# Patient Record
Sex: Female | Born: 1975 | Race: White | Hispanic: No | Marital: Married | Smoking: Former smoker
Health system: Southern US, Community
[De-identification: ages and names within clinical notes are randomized; demographics above are authoritative.]

## PROBLEM LIST (undated history)

## (undated) DIAGNOSIS — L309 Dermatitis, unspecified: Secondary | ICD-10-CM

## (undated) DIAGNOSIS — G43909 Migraine, unspecified, not intractable, without status migrainosus: Secondary | ICD-10-CM

## (undated) DIAGNOSIS — L509 Urticaria, unspecified: Secondary | ICD-10-CM

## (undated) DIAGNOSIS — J45909 Unspecified asthma, uncomplicated: Secondary | ICD-10-CM

## (undated) HISTORY — DX: Urticaria, unspecified: L50.9

## (undated) HISTORY — DX: Dermatitis, unspecified: L30.9

## (undated) HISTORY — PX: WISDOM TOOTH EXTRACTION: SHX21

## (undated) HISTORY — DX: Migraine, unspecified, not intractable, without status migrainosus: G43.909

## (undated) HISTORY — PX: SINOSCOPY: SHX187

## (undated) HISTORY — DX: Unspecified asthma, uncomplicated: J45.909

---

## 2019-12-11 DIAGNOSIS — B029 Zoster without complications: Secondary | ICD-10-CM

## 2019-12-11 HISTORY — DX: Zoster without complications: B02.9

## 2020-01-19 DIAGNOSIS — J301 Allergic rhinitis due to pollen: Secondary | ICD-10-CM | POA: Diagnosis not present

## 2020-01-19 DIAGNOSIS — B001 Herpesviral vesicular dermatitis: Secondary | ICD-10-CM | POA: Diagnosis not present

## 2020-01-19 DIAGNOSIS — J329 Chronic sinusitis, unspecified: Secondary | ICD-10-CM | POA: Diagnosis not present

## 2020-01-19 DIAGNOSIS — M25511 Pain in right shoulder: Secondary | ICD-10-CM | POA: Diagnosis not present

## 2020-01-19 DIAGNOSIS — E039 Hypothyroidism, unspecified: Secondary | ICD-10-CM | POA: Diagnosis not present

## 2020-01-23 DIAGNOSIS — J320 Chronic maxillary sinusitis: Secondary | ICD-10-CM | POA: Diagnosis not present

## 2020-01-25 DIAGNOSIS — G43909 Migraine, unspecified, not intractable, without status migrainosus: Secondary | ICD-10-CM | POA: Diagnosis not present

## 2020-01-30 DIAGNOSIS — J309 Allergic rhinitis, unspecified: Secondary | ICD-10-CM | POA: Diagnosis not present

## 2020-01-31 ENCOUNTER — Ambulatory Visit
Admission: RE | Admit: 2020-01-31 | Discharge: 2020-01-31 | Disposition: A | Discharge status: Home / Self Care | Payer: BC Managed Care – PPO | Source: Ambulatory Visit | Attending: Internal Medicine | Admitting: Internal Medicine

## 2020-01-31 ENCOUNTER — Other Ambulatory Visit: Payer: Self-pay | Admitting: Internal Medicine

## 2020-01-31 DIAGNOSIS — M25512 Pain in left shoulder: Secondary | ICD-10-CM | POA: Diagnosis not present

## 2020-01-31 DIAGNOSIS — S00522S Blister (nonthermal) of oral cavity, sequela: Secondary | ICD-10-CM | POA: Diagnosis not present

## 2020-01-31 DIAGNOSIS — E039 Hypothyroidism, unspecified: Secondary | ICD-10-CM | POA: Diagnosis not present

## 2020-01-31 DIAGNOSIS — J301 Allergic rhinitis due to pollen: Secondary | ICD-10-CM | POA: Diagnosis not present

## 2020-02-22 DIAGNOSIS — N644 Mastodynia: Secondary | ICD-10-CM | POA: Diagnosis not present

## 2020-02-22 DIAGNOSIS — N6314 Unspecified lump in the right breast, lower inner quadrant: Secondary | ICD-10-CM | POA: Diagnosis not present

## 2020-02-22 DIAGNOSIS — R110 Nausea: Secondary | ICD-10-CM | POA: Diagnosis not present

## 2020-02-26 DIAGNOSIS — N644 Mastodynia: Secondary | ICD-10-CM | POA: Diagnosis not present

## 2020-02-26 DIAGNOSIS — R922 Inconclusive mammogram: Secondary | ICD-10-CM | POA: Diagnosis not present

## 2020-02-26 DIAGNOSIS — N6489 Other specified disorders of breast: Secondary | ICD-10-CM | POA: Diagnosis not present

## 2020-03-07 DIAGNOSIS — R109 Unspecified abdominal pain: Secondary | ICD-10-CM | POA: Diagnosis not present

## 2020-03-07 DIAGNOSIS — R110 Nausea: Secondary | ICD-10-CM | POA: Diagnosis not present

## 2020-03-13 DIAGNOSIS — E782 Mixed hyperlipidemia: Secondary | ICD-10-CM | POA: Diagnosis not present

## 2020-03-13 DIAGNOSIS — N951 Menopausal and female climacteric states: Secondary | ICD-10-CM | POA: Diagnosis not present

## 2020-03-13 DIAGNOSIS — E039 Hypothyroidism, unspecified: Secondary | ICD-10-CM | POA: Diagnosis not present

## 2020-03-13 DIAGNOSIS — R635 Abnormal weight gain: Secondary | ICD-10-CM | POA: Diagnosis not present

## 2020-03-13 DIAGNOSIS — R7989 Other specified abnormal findings of blood chemistry: Secondary | ICD-10-CM | POA: Diagnosis not present

## 2020-03-16 DIAGNOSIS — S93504A Unspecified sprain of right lesser toe(s), initial encounter: Secondary | ICD-10-CM | POA: Diagnosis not present

## 2020-03-21 DIAGNOSIS — E039 Hypothyroidism, unspecified: Secondary | ICD-10-CM | POA: Diagnosis not present

## 2020-03-21 DIAGNOSIS — N644 Mastodynia: Secondary | ICD-10-CM | POA: Diagnosis not present

## 2020-03-21 DIAGNOSIS — Z01419 Encounter for gynecological examination (general) (routine) without abnormal findings: Secondary | ICD-10-CM | POA: Diagnosis not present

## 2020-03-21 DIAGNOSIS — F3281 Premenstrual dysphoric disorder: Secondary | ICD-10-CM | POA: Diagnosis not present

## 2020-03-28 DIAGNOSIS — N951 Menopausal and female climacteric states: Secondary | ICD-10-CM | POA: Diagnosis not present

## 2020-03-28 DIAGNOSIS — G43519 Persistent migraine aura without cerebral infarction, intractable, without status migrainosus: Secondary | ICD-10-CM | POA: Diagnosis not present

## 2020-03-28 DIAGNOSIS — D509 Iron deficiency anemia, unspecified: Secondary | ICD-10-CM | POA: Diagnosis not present

## 2020-03-28 DIAGNOSIS — E039 Hypothyroidism, unspecified: Secondary | ICD-10-CM | POA: Diagnosis not present

## 2020-03-28 DIAGNOSIS — Z6828 Body mass index (BMI) 28.0-28.9, adult: Secondary | ICD-10-CM | POA: Diagnosis not present

## 2020-03-28 DIAGNOSIS — E782 Mixed hyperlipidemia: Secondary | ICD-10-CM | POA: Diagnosis not present

## 2020-04-10 DIAGNOSIS — E039 Hypothyroidism, unspecified: Secondary | ICD-10-CM | POA: Diagnosis not present

## 2020-04-22 DIAGNOSIS — D225 Melanocytic nevi of trunk: Secondary | ICD-10-CM | POA: Diagnosis not present

## 2020-04-22 DIAGNOSIS — L821 Other seborrheic keratosis: Secondary | ICD-10-CM | POA: Diagnosis not present

## 2020-04-22 DIAGNOSIS — D1801 Hemangioma of skin and subcutaneous tissue: Secondary | ICD-10-CM | POA: Diagnosis not present

## 2020-04-22 DIAGNOSIS — L814 Other melanin hyperpigmentation: Secondary | ICD-10-CM | POA: Diagnosis not present

## 2020-05-06 DIAGNOSIS — E041 Nontoxic single thyroid nodule: Secondary | ICD-10-CM | POA: Diagnosis not present

## 2020-05-06 DIAGNOSIS — E039 Hypothyroidism, unspecified: Secondary | ICD-10-CM | POA: Diagnosis not present

## 2020-05-06 DIAGNOSIS — Z8349 Family history of other endocrine, nutritional and metabolic diseases: Secondary | ICD-10-CM | POA: Diagnosis not present

## 2020-05-06 DIAGNOSIS — Z8619 Personal history of other infectious and parasitic diseases: Secondary | ICD-10-CM | POA: Diagnosis not present

## 2020-05-16 DIAGNOSIS — J069 Acute upper respiratory infection, unspecified: Secondary | ICD-10-CM | POA: Diagnosis not present

## 2020-05-16 DIAGNOSIS — Z03818 Encounter for observation for suspected exposure to other biological agents ruled out: Secondary | ICD-10-CM | POA: Diagnosis not present

## 2020-05-20 DIAGNOSIS — H52221 Regular astigmatism, right eye: Secondary | ICD-10-CM | POA: Diagnosis not present

## 2020-05-23 DIAGNOSIS — J309 Allergic rhinitis, unspecified: Secondary | ICD-10-CM | POA: Diagnosis not present

## 2020-05-23 DIAGNOSIS — J320 Chronic maxillary sinusitis: Secondary | ICD-10-CM | POA: Diagnosis not present

## 2020-06-05 DIAGNOSIS — E039 Hypothyroidism, unspecified: Secondary | ICD-10-CM | POA: Diagnosis not present

## 2020-06-19 DIAGNOSIS — G43009 Migraine without aura, not intractable, without status migrainosus: Secondary | ICD-10-CM | POA: Diagnosis not present

## 2020-06-24 ENCOUNTER — Other Ambulatory Visit: Payer: Self-pay

## 2020-06-24 ENCOUNTER — Encounter: Payer: Self-pay | Admitting: Allergy

## 2020-06-24 ENCOUNTER — Ambulatory Visit (INDEPENDENT_AMBULATORY_CARE_PROVIDER_SITE_OTHER): Payer: BC Managed Care – PPO | Admitting: Allergy

## 2020-06-24 VITALS — BP 114/70 | HR 88 | Temp 98.1°F | Resp 18 | Ht 68.0 in | Wt 183.0 lb

## 2020-06-24 DIAGNOSIS — T781XXD Other adverse food reactions, not elsewhere classified, subsequent encounter: Secondary | ICD-10-CM

## 2020-06-24 DIAGNOSIS — J310 Chronic rhinitis: Secondary | ICD-10-CM | POA: Insufficient documentation

## 2020-06-24 DIAGNOSIS — Z87898 Personal history of other specified conditions: Secondary | ICD-10-CM | POA: Diagnosis not present

## 2020-06-24 DIAGNOSIS — Z91030 Bee allergy status: Secondary | ICD-10-CM | POA: Diagnosis not present

## 2020-06-24 DIAGNOSIS — Z91038 Other insect allergy status: Secondary | ICD-10-CM

## 2020-06-24 DIAGNOSIS — T781XXA Other adverse food reactions, not elsewhere classified, initial encounter: Secondary | ICD-10-CM | POA: Insufficient documentation

## 2020-06-24 DIAGNOSIS — Z8619 Personal history of other infectious and parasitic diseases: Secondary | ICD-10-CM

## 2020-06-24 DIAGNOSIS — Z872 Personal history of diseases of the skin and subcutaneous tissue: Secondary | ICD-10-CM

## 2020-06-24 DIAGNOSIS — Z130 Encounter for screening for diseases of the blood and blood-forming organs and certain disorders involving the immune mechanism: Secondary | ICD-10-CM | POA: Insufficient documentation

## 2020-06-24 MED ORDER — EPINEPHRINE 0.3 MG/0.3ML IJ SOAJ: 0.3000 mg | INTRAMUSCULAR | 1 refills | Status: DC | PRN

## 2020-06-24 MED ORDER — AZELASTINE HCL 0.1 % NA SOLN: 1.0000 | NASAL | 5 refills | Status: DC | PRN

## 2020-06-24 NOTE — Progress Notes (Addendum)
New Patient Note  RE: Theresa Black MRN: [id number] DOB: [birth year] Date of Office Visit: 06/24/2020  Referring provider: Talmage Coin, MD Primary care provider: Henrine Screws, MD  Chief Complaint: Herpes Zoster (history of shingles. has had two episodes in the past two years. believes it is related to her thyroid.)  History of Present Illness: I had the pleasure of seeing Theresa Black for initial evaluation at the [facility] on 06/25/2020. She is a 44 y.o. female, who is referred here by Dr. Sharl Ma (endocrinology) for the evaluation of immune system and allergic rhinitis.   Immune system: Patient developed blistering rash on her lips after she was done taking care of her husband with COVID-19 in March 2020. [place] she developed similar rash on her lip and she thought it was due to the stress of moving. She also developed some diffuse joint pains which she thought was from moving. Initially thought she just had a HSV but then she had antibodies checked for Shingles which was positive per patient report - in process of obtaining records.   She did not get the Shingles vaccine due to her age. Patient had chicken pox as a child.    Patient has history of multiple infections including sinus infection, ear infections. Denies any pneumonia, GI infections/diarrhea, skin infections/abscesses. Patient has no history of opportunistic infections including fungal infections.     Patient reports 5 antibiotic use in the last 12 months and 0 hospital admissions. Patient does not have any secondary causes of immunodeficiency including chronic steroid use, diabetes mellitus, protein losing enteropathy, renal or hepatic dysfunction, history of cancer or irradiation or history of HIV, hepatitis B or C.  Rhinitis: She reports symptoms of sinus pressure, nasal congestion, rhinorrhea . Symptoms have been going on for 30+ years. The symptoms are present all  year around with worsening in spring through fall. Anosmia: no. Headache: yes. She has used azelastine, Claritin, zyrtec with some improvement in symptoms. Steroid nasal sprays cause acid reflux. Sinus infections: yes. Previous work up includes: last skin testing in 2020 by ENT showed multiple positives per patient report. Patient was most recently on SLIT by ENT with good benefit. She also had allergy injections in the past as well which did not help.  Previous ENT evaluation: yes and had surgery in 2019. History of nasal polyps: no. History of reflux: yes but not on medications currently.   Patient follows with endocrinology for hypothyroidism.    Assessment and Plan: Theresa Black is a 44 y.o. female with: Encounter for screening for diseases of the blood and blood-forming organs and certain disorders involving the immune mechanism Patient had 2 episodes of blistering lip rash which was initially thought to be from HSV but had positive Shingles antibodies per patient report. Both episodes occurred after increased personal stress. There is also history of frequent sinus infections and using 5 courses of antibiotics within the last 12 months.  Awaiting bloodwork results. . Get bloodwork to look at immune system.   Keep track of infections and antibiotics use.  Patient may benefit from having antiviral oral agent on hand to take at first sign of repeat blistering rash.   Chronic rhinitis Perennial rhinitis symptoms with frequent sinus infections for the last 30+ years.  Used azelastine and over-the-counter antihistamines with minimal benefit.  Steroid nasal sprays cause acid reflux.  Patient was followed by ENT and had skin testing done in 2020 which was positive to grass,  weed, trees, molds, dust mites, cockroach, cat and dog.  She was on SLIT therapy with good benefit. Allergy injections did not work as well in the past. Sinus surgery in 2019.  Today's skin testing showed: Borderline positive  to mouse. Negative to other indoor/outdoor allergens. Negative to wheat.   Will get bloodwork to double check this.   May use over the counter antihistamines such as Zyrtec (cetirizine), Claritin (loratadine), Allegra (fexofenadine), or Xyzal (levocetirizine) daily as needed.  May use azelastine nasal spray 1-2 sprays per nostril twice a day as needed for runny nose/drainage.  Nasal saline spray (i.e., Simply Saline) or nasal saline lavage (i.e., NeilMed) is recommended as needed and prior to medicated nasal sprays.  History of wheezing Patient used to be on Singulair but not taking anymore.  States that allergies will sometimes cause wheezing however no issues since she moved to the [place] area.  No prior inhaler use.  Today's spirometry was normal.  Monitor symptoms.  Adverse food reaction Currently avoiding gluten as it flares her IBS symptoms and causes some type of knots on her skin.  She apparently had multiple celiac panel bloodwork which were negative.  Today's skin testing was negative to wheat.  Advised patient to continue to avoid gluten as she may have a non-IgE mediated intolerance to gluten.  History of urticaria History of hives started at age 89 but no recent outbreaks.  No specific triggers noted.  Monitor symptoms. Take pictures if hives re-occur.   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  Continue avoidance and will check for hymenoptera panel and tryptase level.  Return in about 3 months (around 09/24/2020).   ADDENDUM:  01/19/2020 bloodwork. Varicella zoster IgG 1020 (immune >165) HSV1 IgG 46.50 HSV 2 IgG <0.91  Meds ordered this encounter  Medications  . EPINEPHrine (AUVI-Q) 0.3 mg/0.3 mL IJ SOAJ injection    Sig: Inject 0.3 mLs (0.3 mg total) into the muscle as needed for anaphylaxis.    Dispense:  2 each    Refill:  1    [id number]  . azelastine  (ASTELIN) 0.1 % nasal spray    Sig: Place 1-2 sprays into both nostrils as needed for rhinitis.    Dispense:  30 mL    Refill:  5    Lab Orders     CBC with Differential/Platelet     IgG, IgA, IgM     Lymph Enumeration, Basic & NK Cells     Strep pneumoniae 23 Serotypes IgG     Diphtheria / Tetanus Antibody Panel     Complement, total     Allergens w/Total IgE Area 2     Wheat IgE     Allergen Gluten f79     Allergen Hymenoptera Panel     Tryptase  Other allergy screening: Asthma:   Patient used to be on Singulair but currently is not taking it. Triggers include allergies which sometimes caused wheezing. No issues since moved to [place] area.   No prior inhaler use.    Food allergy: yes  Gluten causes some type of knots on her skin and causes IBS symptoms.  Medication allergy: yes Hymenoptera allergy: large localized swelling.  Urticaria: yes  Hives started at age 89 but no recent outbreaks. Eczema:no  Diagnostics: Spirometry:  Tracings reviewed. Her effort: Good reproducible efforts. FVC: 4.24L FEV1: 3.54L, 105% predicted FEV1/FVC ratio: 83% Interpretation: Spirometry consistent with normal pattern.  Please see scanned spirometry results for details.  Skin Testing: Environmental allergy panel and select foods. Borderline positive to mouse. Negative to other indoor/outdoor allergens. Negative to wheat.  Results discussed with patient/family.  Airborne Adult Perc - 06/24/20 0900    Time Antigen Placed 0940    Allergen Manufacturer Waynette Buttery    Location Back    Number of Test 59    2. Control-Histamine 1 mg/ml 2+    3. Albumin saline Negative    4. Bahia Negative    5. French Southern Territories Negative    6. Johnson Negative    7. Kentucky Blue Negative    8. Meadow Fescue Negative    9. Perennial Rye Negative    10. Sweet Vernal Negative    11. Timothy Negative    12. Cocklebur Negative    13. Burweed Marshelder Negative    14. Ragweed, short Negative    15. Ragweed, Giant Negative     16. Plantain,  English Negative    17. Lamb's Quarters Negative    18. Sheep Sorrell Negative    19. Rough Pigweed Negative    20. Marsh Elder, Rough Negative    21. Mugwort, Common Negative    22. Ash mix Negative    23. Birch mix Negative    24. Beech American Negative    25. Box, Elder Negative    26. Cedar, red Negative    27. Cottonwood, Guinea-Bissau Negative    28. Elm mix Negative    29. Hickory Negative    30. Maple mix Negative    31. Oak, Guinea-Bissau mix Negative    32. Pecan Pollen Negative    33. Pine mix Negative    34. Sycamore Eastern Negative    35. Walnut, Black Pollen Negative    36. Alternaria alternata Negative    37. Cladosporium Herbarum Negative    38. Aspergillus mix Negative    39. Penicillium mix Negative    40. Bipolaris sorokiniana (Helminthosporium) Negative    41. Drechslera spicifera (Curvularia) Negative    42. Mucor plumbeus Negative    43. Fusarium moniliforme Negative    44. Aureobasidium pullulans (pullulara) Negative    45. Rhizopus oryzae Negative    46. Botrytis cinera Negative    47. Epicoccum nigrum Negative    48. Phoma betae Negative    49. Candida Albicans Negative    50. Trichophyton mentagrophytes Negative    51. Mite, D Farinae  5,000 AU/ml Negative    52. Mite, D Pteronyssinus  5,000 AU/ml Negative    53. Cat Hair 10,000 BAU/ml Negative    54.  Dog Epithelia Negative    55. Mixed Feathers Negative    56. Horse Epithelia Negative    57. Cockroach, German Negative    58. Mouse --   -/+   15. Tobacco Leaf Negative          Food Adult Perc - 06/24/20 0900    Time Antigen Placed 0940    Allergen Manufacturer Waynette Buttery    Location Back    Number of allergen test 1    3. Wheat Negative           Past Medical History: Patient Active Problem List   Diagnosis Date Noted  . Encounter for screening for diseases of the blood and blood-forming organs and certain disorders involving the immune mechanism 06/24/2020  . History of  shingles 06/24/2020  . History of wheezing 06/24/2020  . History of urticaria 06/24/2020  . Chronic rhinitis 06/24/2020  .  Adverse food reaction 06/24/2020  . Hymenoptera allergy 06/24/2020   Past Medical History:  Diagnosis Date  . Asthma   . Eczema   . Urticaria    Past Surgical History: Past Surgical History:  Procedure Laterality Date  . CESAREAN SECTION    . SINOSCOPY    . WISDOM TOOTH EXTRACTION     Medication List:  Current Outpatient Medications  Medication Sig Dispense Refill  . Acetaminophen (TYLENOL EXTRA STRENGTH PO) Take by mouth.    Marland Kitchen azelastine (ASTELIN) 0.1 % nasal spray Place 1-2 sprays into both nostrils as needed for rhinitis. 30 mL 5  . Cetirizine HCl 10 MG CAPS SMARTSIG:By Mouth    . Diclofenac Potassium (ZIPSOR) 25 MG CAPS Take by mouth.    Marland Kitchen FLUoxetine (PROZAC) 20 MG capsule Take by mouth.    Marland Kitchen ibuprofen (ADVIL) 800 MG tablet     . loratadine (CLARITIN) 10 MG tablet Take 10 mg by mouth daily.    Marland Kitchen TIROSINT 100 MCG CAPS Take 1 capsule by mouth daily.    Marland Kitchen EPINEPHrine (AUVI-Q) 0.3 mg/0.3 mL IJ SOAJ injection Inject 0.3 mLs (0.3 mg total) into the muscle as needed for anaphylaxis. 2 each 1   No current facility-administered medications for this visit.   Allergies: Allergies  Allergen Reactions  . Amoxicillin Hives  . Sulfamethoxazole-Trimethoprim Nausea And Vomiting and Hives  . Levonorgestrel Itching and Other (See Comments)    She gets irritable , and rash from any hormone replacement She gets irritable , and rash from any hormone replacement   . Orphenadrine Citrate Other (See Comments)    EMOTIONAL DISTRESS "can't stop crying" "can't stop crying" EMOTIONAL DISTRESS    Social History: Social History   Socioeconomic History  . Marital status: Married    Spouse name: Not on file  . Number of children: Not on file  . Years of education: Not on file  . Highest education level: Not on file  Occupational History  . Not on file  Tobacco  Use  . Smoking status: Former Smoker    Quit date: 2004    Years since quitting: 17.6  . Smokeless tobacco: Never Used  Vaping Use  . Vaping Use: Never used  Substance and Sexual Activity  . Alcohol use: Yes    Comment: on occasion  . Drug use: Never  . Sexual activity: Not on file  Other Topics Concern  . Not on file  Social History Narrative  . Not on file   Social Determinants of Health   Financial Resource Strain:   . Difficulty of Paying Living Expenses:   Food Insecurity:   . Worried About Programme researcher, broadcasting/film/video in the Last Year:   . Barista in the Last Year:   Transportation Needs:   . Freight forwarder (Medical):   Marland Kitchen Lack of Transportation (Non-Medical):   Physical Activity:   . Days of Exercise per Week:   . Minutes of Exercise per Session:   Stress:   . Feeling of Stress :   Social Connections:   . Frequency of Communication with Friends and Family:   . Frequency of Social Gatherings with Friends and Family:   . Attends Religious Services:   . Active Member of Clubs or Organizations:   . Attends Banker Meetings:   Marland Kitchen Marital Status:    Lives in a house built in 1998 - moved in January 2021. Smoking: denies Occupation: Designer, fashion/clothing HistorySurveyor, minerals in  the house: yes Carpet in the family room: no Carpet in the bedroom: no Heating: electric Cooling: central Pet: yes 2 cats and 1 dog  Family History: Family History  Problem Relation Age of Onset  . Hashimoto's thyroiditis Mother    Problem                               Relation Eczema                                Daughter  Food allergy                          Daughter   Review of Systems  Constitutional: Negative for appetite change, chills, fever and unexpected weight change.  HENT: Positive for sinus pressure. Negative for congestion and rhinorrhea.   Eyes: Positive for itching.  Respiratory: Negative for cough, chest tightness, shortness  of breath and wheezing.   Cardiovascular: Negative for chest pain.  Gastrointestinal: Negative for abdominal pain.  Genitourinary: Negative for difficulty urinating.  Skin: Negative for rash.  Neurological: Positive for headaches.   Objective: BP 114/70 (BP Location: Right Arm, Patient Position: Sitting, Cuff Size: Normal)   Pulse 88   Temp 98.1 F (36.7 C) (Temporal)   Resp 18   Ht  (1.727 m)   Wt 183 lb (83 kg)   SpO2 99%   BMI 27.83 kg/m  Body mass index is 27.83 kg/m. Physical Exam Vitals and nursing note reviewed.  Constitutional:      Appearance: Normal appearance. She is well-developed.  HENT:     Head: Normocephalic and atraumatic.     Right Ear: Tympanic membrane and external ear normal.     Left Ear: Tympanic membrane and external ear normal.     Nose: Nose normal.     Mouth/Throat:     Mouth: Mucous membranes are moist.     Pharynx: Oropharynx is clear.  Eyes:     Conjunctiva/sclera: Conjunctivae normal.  Cardiovascular:     Rate and Rhythm: Normal rate and regular rhythm.     Heart sounds: Normal heart sounds. No murmur heard.  No friction rub. No gallop.   Pulmonary:     Effort: Pulmonary effort is normal.     Breath sounds: Normal breath sounds. No wheezing, rhonchi or rales.  Musculoskeletal:     Cervical back: Neck supple.  Skin:    General: Skin is warm.     Findings: No rash.  Neurological:     Mental Status: She is alert and oriented to person, place, and time.  Psychiatric:        Behavior: Behavior normal.    The plan was reviewed with the patient/family, and all questions/concerned were addressed.  It was my pleasure to see Johnni today and participate in her care. Please feel free to contact me with any questions or concerns.  Sincerely,  Wyline Mood, DO Allergy & Immunology  [facility] office: [phone] [facility] office: [phone] [facility] office: [phone]  80 minutes  spent face-to-face with more than 50% of the time spent discussing immune issues, wheezing, rhinitis, adverse food reaction, hymenoptera reaction, urticaria.

## 2020-06-24 NOTE — Patient Instructions (Addendum)
Requesting records for Shingles and your previous allergy testing records - sign release form.  . Get bloodwork:  o We are ordering labs, so please allow 1-2 weeks for the results to come back. o With the newly implemented Cures Act, the labs might be visible to you at the same time that they become visible to me. However, I will not address the results until all of the results are back, so please be patient.   Keep track of infections and antibiotics use.  Today's skin testing showed:  Borderline to mouse. Negative to other indoor/outdoor allergens. Negative to wheat.   Will get bloodwork to double check this.   Continue to avoid gluten.   May use over the counter antihistamines such as Zyrtec (cetirizine), Claritin (loratadine), Allegra (fexofenadine), or Xyzal (levocetirizine) daily as needed.  May use azelastine nasal spray 1-2 sprays per nostril twice a day as needed for runny nose/drainage.  Nasal saline spray (i.e., Simply Saline) or nasal saline lavage (i.e., NeilMed) is recommended as needed and prior to medicated nasal sprays.  Breathing:  Your breathing test was normal today.  Monitor symptoms.   Hives:  Monitor and take picture.  Follow up in 3 months or sooner if needed.   Look at PCP  [email or web address]

## 2020-06-25 ENCOUNTER — Encounter: Payer: Self-pay | Admitting: Allergy

## 2020-06-25 DIAGNOSIS — G43909 Migraine, unspecified, not intractable, without status migrainosus: Secondary | ICD-10-CM | POA: Diagnosis not present

## 2020-06-25 NOTE — Assessment & Plan Note (Addendum)
Perennial rhinitis symptoms with frequent sinus infections for the last 30+ years.  Used azelastine and over-the-counter antihistamines with minimal benefit.  Steroid nasal sprays cause acid reflux.  Patient was followed by ENT and had skin testing done in 2020 which was positive to grass, weed, trees, molds, dust mites, cockroach, cat and dog.  She was on SLIT therapy with good benefit. Allergy injections did not work as well in the past. Sinus surgery in 2019.  Today's skin testing showed: Borderline positive to mouse. Negative to other indoor/outdoor allergens. Negative to wheat.   Will get bloodwork to double check this.   May use over the counter antihistamines such as Zyrtec (cetirizine), Claritin (loratadine), Allegra (fexofenadine), or Xyzal (levocetirizine) daily as needed.  May use azelastine nasal spray 1-2 sprays per nostril twice a day as needed for runny nose/drainage.  Nasal saline spray (i.e., Simply Saline) or nasal saline lavage (i.e., NeilMed) is recommended as needed and prior to medicated nasal sprays.

## 2020-06-25 NOTE — Assessment & Plan Note (Signed)
Patient used to be on Singulair but not taking anymore.  States that allergies will sometimes cause wheezing however no issues since she moved to the [place] area.  No prior inhaler use.  Today's spirometry was normal.  Monitor symptoms.

## 2020-06-25 NOTE — Assessment & Plan Note (Signed)
Currently avoiding gluten as it flares her IBS symptoms and causes some type of knots on her skin.  She apparently had multiple celiac panel bloodwork which were negative.  Today's skin testing was negative to wheat.  Advised patient to continue to avoid gluten as she may have a non-IgE mediated intolerance to gluten.

## 2020-06-25 NOTE — Assessment & Plan Note (Signed)
Patient is requesting a refill of her EpiPen as she has large localized swelling after hymenoptera stings in the past.  Continue avoidance and will check for hymenoptera panel and tryptase level.

## 2020-06-25 NOTE — Assessment & Plan Note (Signed)
Patient had 2 episodes of blistering lip rash which was initially thought to be from HSV but had positive Shingles antibodies per patient report. Both episodes occurred after increased personal stress. There is also history of frequent sinus infections and using 5 courses of antibiotics within the last 12 months.  Awaiting bloodwork results. . Get bloodwork to look at immune system.   Keep track of infections and antibiotics use.  Patient may benefit from having antiviral oral agent on hand to take at first sign of repeat blistering rash.

## 2020-06-25 NOTE — Assessment & Plan Note (Addendum)
History of hives started at age 44 but no recent outbreaks.  No specific triggers noted.  Monitor symptoms. Take pictures if hives re-occur.

## 2020-06-27 LAB — TRYPTASE: Tryptase: 4.9 ug/L (ref 2.2–13.2)

## 2020-06-27 LAB — ALLERGEN HYMENOPTERA PANEL
Bumblebee: <0.1 kU/L
Honeybee IgE: <0.1 kU/L
Hornet, White Face, IgE: 0.13 kU/L — AB
Hornet, Yellow, IgE: <0.1 kU/L
Paper Wasp IgE: <0.1 kU/L
Yellow Jacket, IgE: <0.1 kU/L

## 2020-07-02 LAB — LYMPH ENUMERATION, BASIC & NK CELLS
% CD 3 Pos. Lymph.: 77.9 % (ref 57.5–86.2)
% CD 4 Pos. Lymph.: 42.9 % (ref 30.8–58.5)
% NK (CD56/16): 11.5 % (ref 1.4–19.4)
Ab NK (CD56/16): 219 /uL (ref 24–406)
Absolute CD 3: 1480 /uL (ref 622–2402)
Absolute CD 4 Helper: 815 /uL (ref 359–1519)
Basophils Absolute: 0 10*3/uL (ref 0.0–0.2)
Basos: 1 %
CD19 % B Cell: 9.7 % (ref 3.3–25.4)
CD19 Abs: 184 /uL (ref 12–645)
CD4/CD8 Ratio: 1.43 (ref 0.92–3.72)
CD8 % Suppressor T Cell: 30.1 % (ref 12.0–35.5)
CD8 T Cell Abs: 572 /uL (ref 109–897)
EOS (ABSOLUTE): 0.1 10*3/uL (ref 0.0–0.4)
Eos: 2 %
Hematocrit: 44.3 % (ref 34.0–46.6)
Hemoglobin: 14.6 g/dL (ref 11.1–15.9)
Immature Grans (Abs): 0 10*3/uL (ref 0.0–0.1)
Immature Granulocytes: 0 %
Lymphocytes Absolute: 1.9 10*3/uL (ref 0.7–3.1)
Lymphs: 32 %
MCH: 31.3 pg (ref 26.6–33.0)
MCHC: 33 g/dL (ref 31.5–35.7)
MCV: 95 fL (ref 79–97)
Monocytes Absolute: 0.4 10*3/uL (ref 0.1–0.9)
Monocytes: 7 %
Neutrophils Absolute: 3.5 10*3/uL (ref 1.4–7.0)
Neutrophils: 58 %
Platelets: 216 10*3/uL (ref 150–450)
RBC: 4.66 x10E6/uL (ref 3.77–5.28)
RDW: 12.6 % (ref 11.7–15.4)
WBC: 5.9 10*3/uL (ref 3.4–10.8)

## 2020-07-02 LAB — STREP PNEUMONIAE 23 SEROTYPES IGG
Pneumo Ab Type 1*: 0.8 ug/mL — ABNORMAL LOW (ref >1.3)
Pneumo Ab Type 12 (12F)*: 0.2 ug/mL — ABNORMAL LOW (ref >1.3)
Pneumo Ab Type 14*: 2.7 ug/mL (ref >1.3)
Pneumo Ab Type 17 (17F)*: 0.3 ug/mL — ABNORMAL LOW (ref >1.3)
Pneumo Ab Type 19 (19F)*: 1.6 ug/mL (ref >1.3)
Pneumo Ab Type 2*: 0.7 ug/mL — ABNORMAL LOW (ref >1.3)
Pneumo Ab Type 20*: 1.9 ug/mL (ref >1.3)
Pneumo Ab Type 22 (22F)*: 1.3 ug/mL — ABNORMAL LOW (ref >1.3)
Pneumo Ab Type 23 (23F)*: 1.6 ug/mL (ref >1.3)
Pneumo Ab Type 26 (6B)*: 0.4 ug/mL — ABNORMAL LOW (ref >1.3)
Pneumo Ab Type 3*: 3.4 ug/mL (ref >1.3)
Pneumo Ab Type 34 (10A)*: 1.4 ug/mL (ref >1.3)
Pneumo Ab Type 4*: 0.5 ug/mL — ABNORMAL LOW (ref >1.3)
Pneumo Ab Type 43 (11A)*: 2 ug/mL (ref >1.3)
Pneumo Ab Type 5*: 0.6 ug/mL — ABNORMAL LOW (ref >1.3)
Pneumo Ab Type 51 (7F)*: 0.3 ug/mL — ABNORMAL LOW (ref >1.3)
Pneumo Ab Type 54 (15B)*: 2.6 ug/mL (ref >1.3)
Pneumo Ab Type 56 (18C)*: 1.4 ug/mL (ref >1.3)
Pneumo Ab Type 57 (19A)*: 14.1 ug/mL (ref >1.3)
Pneumo Ab Type 68 (9V)*: 1.8 ug/mL (ref >1.3)
Pneumo Ab Type 70 (33F)*: 3.5 ug/mL (ref >1.3)
Pneumo Ab Type 8*: 0.2 ug/mL — ABNORMAL LOW (ref >1.3)
Pneumo Ab Type 9 (9N)*: 0.4 ug/mL — ABNORMAL LOW (ref >1.3)

## 2020-07-02 LAB — ALLERGENS W/TOTAL IGE AREA 2
Alternaria Alternata IgE: <0.1 kU/L
Aspergillus Fumigatus IgE: <0.1 kU/L
Bermuda Grass IgE: <0.1 kU/L
Cat Dander IgE: <0.1 kU/L
Cedar, Mountain IgE: <0.1 kU/L
Cladosporium Herbarum IgE: <0.1 kU/L
Cockroach, German IgE: 0.12 kU/L — AB
Common Silver Birch IgE: <0.1 kU/L
Cottonwood IgE: <0.1 kU/L
D Farinae IgE: <0.1 kU/L
D Pteronyssinus IgE: <0.1 kU/L
Dog Dander IgE: <0.1 kU/L
Elm, American IgE: <0.1 kU/L
IgE (Immunoglobulin E), Serum: 14 IU/mL (ref 6–495)
Johnson Grass IgE: <0.1 kU/L
Maple/Box Elder IgE: <0.1 kU/L
Mouse Urine IgE: <0.1 kU/L
Oak, White IgE: <0.1 kU/L
Pecan, Hickory IgE: <0.1 kU/L
Penicillium Chrysogen IgE: <0.1 kU/L
Pigweed, Rough IgE: <0.1 kU/L
Ragweed, Short IgE: <0.1 kU/L
Sheep Sorrel IgE Qn: <0.1 kU/L
Timothy Grass IgE: <0.1 kU/L
White Mulberry IgE: <0.1 kU/L

## 2020-07-02 LAB — DIPHTHERIA / TETANUS ANTIBODY PANEL
Diphtheria Ab: 0.3 IU/mL (ref <0.10)
Tetanus Ab, IgG: 1.61 IU/mL (ref <0.10)

## 2020-07-02 LAB — ALLERGEN GLUTEN F79: Allergen Gluten IgE: <0.1 kU/L

## 2020-07-02 LAB — IGG, IGA, IGM
IgA/Immunoglobulin A, Serum: 142 mg/dL (ref 87–352)
IgG (Immunoglobin G), Serum: 1254 mg/dL (ref 586–1602)
IgM (Immunoglobulin M), Srm: 86 mg/dL (ref 26–217)

## 2020-07-02 LAB — ALLERGEN, WHEAT, F4: Wheat IgE: <0.1 kU/L

## 2020-07-02 LAB — COMPLEMENT, TOTAL: Compl, Total (CH50): >60 U/mL (ref >41)

## 2020-07-04 ENCOUNTER — Telehealth: Payer: Self-pay | Admitting: Allergy

## 2020-07-04 NOTE — Telephone Encounter (Signed)
She would like know if you have any thoughts on why she continues to get the Shingles, she also would like for you to send in the Rx for the Valacyclovir pills because she no longer sees that primary doctor.   Clarrisa Phone# [phone]

## 2020-07-04 NOTE — Telephone Encounter (Signed)
Please call patient.  I did receive the bloodwork results and she had positive antibodies for varicella.   At this point, I would not recommend getting the Shingles vaccine as her bloodwork did not show any immunodeficiency issues.   I do recommend that she has some oral valacyclovir pills on hand to take at the first onset of symptoms.

## 2020-07-04 NOTE — Telephone Encounter (Signed)
-----   Message from Osa Craver, [place] sent at 07/04/2020  1:59 PM EDT ----- Pt notified of results, expressed understanding. Patient wanted to know if we receive the shingles information she had signed release forms for in order to get the vaccine and if Dr. Selena Batten can see if there is anything to note so she could get the Shingles vaccine.   Joni Reining can you send a copy of patient's results to her home address. Thanks.

## 2020-07-08 MED ORDER — VALACYCLOVIR HCL 1 G PO TABS: 1000.0000 mg | ORAL_TABLET | Freq: Two times a day (BID) | ORAL | 0 refills | Status: DC

## 2020-07-08 NOTE — Telephone Encounter (Signed)
Left vm for patient to call back office.   Sending in valtrex 1000mg  tablet to take twice a day at first onset of symptoms until lesions heal or crust over.  Most likely her trigger is stress given what she has told me.

## 2020-07-08 NOTE — Addendum Note (Signed)
Addended by: Ellamae Sia on: 07/08/2020 04:52 PM   Modules accepted: Orders

## 2020-07-08 NOTE — Telephone Encounter (Signed)
Attempted to call and no answer.

## 2020-07-08 NOTE — Telephone Encounter (Signed)
Left a message letting patient know medication was sent to pharmacy.

## 2020-07-19 DIAGNOSIS — J0110 Acute frontal sinusitis, unspecified: Secondary | ICD-10-CM | POA: Diagnosis not present

## 2020-07-23 DIAGNOSIS — L814 Other melanin hyperpigmentation: Secondary | ICD-10-CM | POA: Diagnosis not present

## 2020-07-23 DIAGNOSIS — L110 Acquired keratosis follicularis: Secondary | ICD-10-CM | POA: Diagnosis not present

## 2020-07-23 DIAGNOSIS — D485 Neoplasm of uncertain behavior of skin: Secondary | ICD-10-CM | POA: Diagnosis not present

## 2020-07-23 DIAGNOSIS — D225 Melanocytic nevi of trunk: Secondary | ICD-10-CM | POA: Diagnosis not present

## 2020-08-12 ENCOUNTER — Ambulatory Visit: Payer: BC Managed Care – PPO | Admitting: Neurology

## 2020-08-12 ENCOUNTER — Encounter: Payer: Self-pay | Admitting: Neurology

## 2020-08-13 ENCOUNTER — Other Ambulatory Visit: Payer: Self-pay | Admitting: Allergy

## 2020-08-13 NOTE — Telephone Encounter (Signed)
Please call patient and ask if she already used all the 30 pills? Did it help?  These were sent in at the end of August.

## 2020-08-13 NOTE — Telephone Encounter (Signed)
Please advise to refill if it is appropriate or not?

## 2020-08-15 ENCOUNTER — Telehealth: Payer: Self-pay | Admitting: Allergy

## 2020-08-15 NOTE — Telephone Encounter (Signed)
Pt states that she doesn't need this filled because she had a severe reaction to it. Pt would like to know if she needs to schedule an appt to figure out what she could do in replace of this? She still deals with shingles. Pt states that she also had a question about the allergy testing that she had done. She doesn't feel like the testing was done correctly. She states that she didn't feel the pricks go under her skin so of course the test would read negative to everything. Pt states that she had some testing done in [place] prior to coming here and she reacted to just about everything they tested. She wants to know if her insurance will still be billed for an invalid test?

## 2020-08-15 NOTE — Telephone Encounter (Signed)
Patient is still having shingles symptoms, and had reaction to valacyclovir- causing palpitations, sores in mouth, dizziness causing her to be really ill.Patient stated she is still having these issues and would be evaluated by the infectious disease and will seek further evaluation on her other symptoms by her ENT.

## 2020-08-15 NOTE — Telephone Encounter (Signed)
Please call patient.  If she's still dealing with the shingles - I recommend that she be evaluated by infectious disease next. The bloodwork we had drawn did not show any immunodeficiency issues.   What kind of reaction did she have to the valacyclovir?   The skin prick testing was done appropriately as she had a good reaction to the positive control. This positive control we do on every patient to make sure they are going to react appropriately.  I would not call this an invalid test.  The environmental allergy panel via bloodwork was also negative which confirms the skin testing results.   She had her testing done previously by an ENT office and sometimes they do things differently - she can call them and have them repeat their testing if she wants to.

## 2020-08-20 ENCOUNTER — Other Ambulatory Visit: Payer: Self-pay

## 2020-08-20 ENCOUNTER — Ambulatory Visit (INDEPENDENT_AMBULATORY_CARE_PROVIDER_SITE_OTHER): Payer: BC Managed Care – PPO | Admitting: Neurology

## 2020-08-20 ENCOUNTER — Telehealth: Payer: Self-pay | Admitting: Neurology

## 2020-08-20 ENCOUNTER — Encounter: Payer: Self-pay | Admitting: Neurology

## 2020-08-20 VITALS — BP 126/80 | HR 85 | Ht 68.0 in | Wt 184.4 lb

## 2020-08-20 DIAGNOSIS — G43709 Chronic migraine without aura, not intractable, without status migrainosus: Secondary | ICD-10-CM

## 2020-08-20 MED ORDER — DICLOFENAC POTASSIUM(MIGRAINE) 50 MG PO PACK: 50.0000 mg | PACK | Freq: Two times a day (BID) | ORAL | 11 refills | Status: DC | PRN

## 2020-08-20 MED ORDER — RIZATRIPTAN BENZOATE 10 MG PO TBDP
10.0000 mg | ORAL_TABLET | ORAL | 11 refills | Status: DC | PRN
Start: 2020-08-20 — End: 2020-12-03

## 2020-08-20 NOTE — Progress Notes (Signed)
[facility]    Provider:  Dr Lucia GaskinsAhern Requesting Provider: Henrine Screwshacker, Robert, MD Primary Care Provider:  Henrine Screwshacker, Robert, MD  CC:  migraines  HPI:  Riley NearingCatherine Hann is a 44 y.o. female here as requested by Henrine Screwshacker, Robert, MD for migraines. Migraines started since her 6120s, become worse in recent years. She has been to  neurology. She is here after moving. She has chronic migraines. MIgraines start on the left side, move to the forehead and temples all the way to the apex, dull then becomes pulsating/pounding/throbbing, light and sound sensitivity, nausea, moderately severe to severe, no aura, 24 to 72 hours, movement makes it worse, a dark room helps, no medication overuse, nothing over the counter works. Over the last year or longer daily headaches 16- 20 migraine days a month. Allergies are a trigger, worse during allergies. Tried and failed multiple medications. Not positional, not exertional, no vision changes. No other focal neurologic deficits, associated symptoms, inciting events or modifiable factors.  Medications tried include: propranolol, cymbalta, Aimovig severe reaction (CGRPs tried and failed contraindicated), fluoxetine, topiramate, amitriptyline, nortriptyline, magnesum, b2(migrelief), zipsor, rizatriptan, sumatriptan, ibuprofen, tylenol, caffeine, alleve(allergy), benadryl, diclofenac tabs, zipsor  Reviewed notes, labs and imaging from outside physicians, which showed:  [facility], she has a history of migraines which began in high school, at that time her headaches were more frequent in the spring due to allergies and hayfever but the rest of the year they were to occur about once a month around her menstrual periods, her headaches lasted for around 4 hours and were relieved with ibuprofen and had massage, in college she would only have a few headaches each spring with associated nausea, photophobia, phonophobia, she felt that  Effexor helped for depression, but since then she had waxing and waning in the severity of her headaches, multiple headaches per week with associated nausea photophobia and phonophobia, resolved with ibuprofen and Excedrin, naratriptan was not effective but Zipsor helped, she thinks she had allergic reaction to my relief, she tried propranolol and Topamax as well, propranolol increased did not help, she tried Cymbalta as well, propranolol and Cymbalta caused some side effects with cognition.  Reviewed CT Sinus report as below:   INDICATION: J32.0 Chronic maxillary sinusitis, J01.91 Acute recurrent  sinusitis, unspecified, chronic maxillary sinus pressure, recurrent acute  sinusitis provided.  .   COMPARISON: None   TECHNIQUE/PROTOCOL: Standard noncontrast images through the paranasal  sinuses including direct axial images and reformatted coronal and sagittal  images.   FINDINGS:    The right maxillary sinus is normal. The right frontal sinus is normal. The right ethmoid air cells are normal . The right sphenoid sinus is normal. The left maxillary sinus has a moderate amount of mucosal thickening along its caudad aspect, with stenosis of the osteomeatal complex . The left frontal sinus is normal. The left ethmoid air cells are normal. The left sphenoid is normal.   The right ostiomeatal complex is normal. The frontal recess and  sphenoethmoidal recesses are patent.   No air-fluid levels are identified. No bony thickening, sinus expansion or  erosion. The nasal septum is mildly deviated tothe right. The mastoid air  cells are normal. The visualized portions of the brain and orbits are  unremarkable. 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   CMP and TSH normal 03/2020  Review of Systems: Patient complains  of symptoms per HPI as well as the  following symptoms: migraines. Pertinent negatives and positives per HPI. All others negative.   Social History   Socioeconomic History  . Marital status: Married    Spouse name: Not on file  . Number of children: 1  . Years of education: Not on file  . Highest education level: Bachelor's degree (e.g., BA, AB, BS)  Occupational History    Comment: work from home  Tobacco Use  . Smoking status: Former Smoker    Quit date: 2004    Years since quitting: 17.7  . Smokeless tobacco: Never Used  Vaping Use  . Vaping Use: Never used  Substance and Sexual Activity  . Alcohol use: Yes    Comment: on occasion  . Drug use: Never  . Sexual activity: Not on file  Other Topics Concern  . Not on file  Social History Narrative   Lives with husband, child   Caffeine- coffee 3-5 c daily   Social Determinants of Health   Financial Resource Strain:   . Difficulty of Paying Living Expenses: Not on file  Food Insecurity:   . Worried About Programme researcher, broadcasting/film/video in the Last Year: Not on file  . Ran Out of Food in the Last Year: Not on file  Transportation Needs:   . Lack of Transportation (Medical): Not on file  . Lack of Transportation (Non-Medical): Not on file  Physical Activity:   . Days of Exercise per Week: Not on file  . Minutes of Exercise per Session: Not on file  Stress:   . Feeling of Stress : Not on file  Social Connections:   . Frequency of Communication with Friends and Family: Not on file  . Frequency of Social Gatherings with Friends and Family: Not on file  . Attends Religious Services: Not on file  . Active Member of Clubs or Organizations: Not on file  . Attends Banker Meetings: Not on file  . Marital Status: Not on file  Intimate Partner Violence:   . Fear of Current or Ex-Partner: Not on file  . Emotionally Abused: Not on file  . Physically Abused: Not on file  . Sexually Abused: Not on file    Family History  Problem Relation Age of Onset  .  Hashimoto's thyroiditis Mother     Past Medical History:  Diagnosis Date  . Asthma   . Eczema   . Migraines   . Shingles 12/2019  . Urticaria     Patient Active Problem List   Diagnosis Date Noted  . Encounter for screening for diseases of the blood and blood-forming organs and certain disorders involving the immune mechanism 06/24/2020  . History of shingles 06/24/2020  . History of wheezing 06/24/2020  . History of urticaria 06/24/2020  . Chronic rhinitis 06/24/2020  . Adverse food reaction 06/24/2020  . 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 CESAREAN SECTION    . SINOSCOPY    . WISDOM TOOTH EXTRACTION      Current Outpatient Medications  Medication Sig Dispense Refill  . Acetaminophen (TYLENOL EXTRA STRENGTH PO) Take by mouth.    Marland Kitchen azelastine (ASTELIN) 0.1 % nasal spray Place 1-2 sprays into both nostrils as needed for rhinitis. 30 mL 5  . Cetirizine HCl 10 MG CAPS SMARTSIG:By Mouth    . Diclofenac Potassium (ZIPSOR) 25 MG CAPS Take by mouth.    . EPINEPHrine (AUVI-Q) 0.3 mg/0.3 mL IJ SOAJ  injection Inject 0.3 mLs (0.3 mg total) into the muscle as needed for anaphylaxis. 2 each 1  . FLUoxetine (PROZAC) 20 MG capsule Take by mouth.    Marland Kitchen ibuprofen (ADVIL) 800 MG tablet     . loratadine (CLARITIN) 10 MG tablet Take 10 mg by mouth daily.    Marland Kitchen TIROSINT 88 MCG CAPS Take 1 capsule by mouth daily.    . Diclofenac Potassium,Migraine, 50 MG PACK Take 50 mg by mouth 2 (two) times daily as needed. For migraine. 9 each 11  . rizatriptan (MAXALT-MLT) 10 MG disintegrating tablet Take 1 tablet (10 mg total) by mouth as needed for migraine. May repeat in 2 hours if needed 9 tablet 11   No current facility-administered medications for this visit.    Allergies as of 08/20/2020 - Review Complete 08/20/2020  Allergen Reaction Noted  . Amoxicillin Hives 05/03/2013  . Sulfamethoxazole-trimethoprim Nausea And Vomiting and Hives 05/03/2013   . Aimovig [erenumab-aooe] Other (See Comments) 08/20/2020  . Levonorgestrel Itching and Other (See Comments) 09/24/2014  . Orphenadrine citrate Other (See Comments) 05/03/2013  . Valtrex [valacyclovir] Other (See Comments) 08/20/2020    Vitals: BP 126/80   Pulse 85   Ht 5\' 8"  (1.727 m)   Wt 184 lb 6.4 oz (83.6 kg)   BMI 28.04 kg/m  Last Weight:  Wt Readings from Last 1 Encounters:  08/20/20 184 lb 6.4 oz (83.6 kg)   Last Height:   Ht Readings from Last 1 Encounters:  08/20/20 5\' 8"  (1.727 m)     Physical exam: Exam: Gen: NAD, conversant, well nourised, overweight , well groomed                     CV: RRR, no MRG. No Carotid Bruits. No peripheral edema, warm, nontender Eyes: Conjunctivae clear without exudates or hemorrhage  Neuro: Detailed Neurologic Exam  Speech:    Speech is normal; fluent and spontaneous with normal comprehension.  Cognition:    The patient is oriented to person, place, and time;     recent and remote memory intact;     language fluent;     normal attention, concentration,     fund of knowledge Cranial Nerves:    The pupils are equal, round, and reactive to light. The fundi are normal and spontaneous venous pulsations are present. Visual fields are full to finger confrontation. Extraocular movements are intact. Trigeminal sensation is intact and the muscles of mastication are normal. The face is symmetric. The palate elevates in the midline. Hearing intact. Voice is normal. Shoulder shrug is normal. The tongue has normal motion without fasciculations.   Coordination:    No dysmetria or ataxia  Gait: Normal native gait.   Motor Observation:    No asymmetry, no atrophy, and no involuntary movements noted. Tone:    Normal muscle tone.    Posture:    Posture is normal. normal erect    Strength:    Strength is V/V in the upper and lower limbs.      Sensation: intact to LT     Reflex Exam:  DTR's:    Deep tendon reflexes in the upper  and lower extremities are normal bilaterally.   Toes:    The toes are downgoing bilaterally.   Clonus:    Clonus is absent.    Assessment/Plan:  44 year old with chronic migraines failed multiple classes of medications, discussed options.   Botox: prevention Acute; Rizatriptan, cambia, (she is fine with zipsor which is  diclofenac so should be fine with cambia)  Consider MRI of the brain if no improvement with treatment.   Discused: To prevent or relieve headaches, try the following: Cool Compress. Lie down and place a cool compress on your head.  Avoid headache triggers. If certain foods or odors seem to have triggered your migraines in the past, avoid them. A headache diary might help you identify triggers.  Include physical activity in your daily routine. Try a daily walk or other moderate aerobic exercise.  Manage stress. Find healthy ways to cope with the stressors, such as delegating tasks on your to-do list.  Practice relaxation techniques. Try deep breathing, yoga, massage and visualization.  Eat regularly. Eating regularly scheduled meals and maintaining a healthy diet might help prevent headaches. Also, drink plenty of fluids.  Follow a regular sleep schedule. Sleep deprivation might contribute to headaches Consider biofeedback. With this mind-body technique, you learn to control certain bodily functions -- such as muscle tension, heart rate and blood pressure -- to prevent headaches or reduce headache pain.    Proceed to emergency room if you experience new or worsening symptoms or symptoms do not resolve, if you have new neurologic symptoms or if headache is severe, or for any concerning symptom.   Provided education and documentation from American headache Society toolbox including articles on: chronic migraine medication overuse headache, chronic migraines, prevention of migraines, behavioral and other nonpharmacologic treatments for headache.   No orders of the defined  types were placed in this encounter.  Meds ordered this encounter  Medications  . rizatriptan (MAXALT-MLT) 10 MG disintegrating tablet    Sig: Take 1 tablet (10 mg total) by mouth as needed for migraine. May repeat in 2 hours if needed    Dispense:  9 tablet    Refill:  11  . Diclofenac Potassium,Migraine, 50 MG PACK    Sig: Take 50 mg by mouth 2 (two) times daily as needed. For migraine.    Dispense:  9 each    Refill:  11    Meds tried: propranolol, cymbalta, Aimovig, fluoxetine, topiramate, amitriptyline, nortriptyline, mag, rizatriptan, sumatriptan, ibuprofen, tylenol, alleve, benadryl, diclofenac tabs, zipsor. 9 or max allowed.    Cc: Henrine Screws, MD,  Henrine Screws, MD  Naomie Dean, MD  [facility] [address] [place], [place] [address]  Phone [phone] Fax [phone]

## 2020-08-20 NOTE — Telephone Encounter (Signed)
Please initiate botox protocol for patient, she can be seen by Lucia Gaskins or NP, first available.

## 2020-08-20 NOTE — Patient Instructions (Addendum)
Prevention: Start Botox Acute: Rizatriptan: Please take one tablet at the onset of your headache. If it does not improve the symptoms please take one additional tablet. Do not take more then 2 tablets in 24hrs. Do not take use more then 2 to 3 times in a week. Cambia: will come from specialty pharmacy  Diclofenac powder for oral solution What is this medicine? DICLOFENAC (dye KLOE fen ak) is a non-steroidal anti-inflammatory drug (NSAID). It is used to treat migraine pain. This medicine may be used for other purposes; ask your health care provider or pharmacist if you have questions. COMMON BRAND NAME(S): Cambia What should I tell my health care provider before I take this medicine? They need to know if you have any of these conditions:  asthma, especially aspirin sensitive asthma  coronary artery bypass graft (CABG) surgery within the past 2 weeks  drink more than 3 alcohol-containing drinks a day  heart disease or circulation problems like heart failure or leg edema (fluid retention)  high blood pressure  kidney disease  liver disease  phenylketonuria  stomach problems  an unusual or allergic reaction to diclofenac, aspirin, other NSAIDs, other medicines, foods, dyes, or preservatives  pregnant or trying to get pregnant  breast-feeding How should I use this medicine? Mix this medicine with 1 to 2 ounces of water. Drink the medicine and water together. Follow the directions on the prescription label. Do not take your medicine more often than directed. Long-term, continuous use may increase the risk of heart attack or stroke. A special MedGuide will be given to you by the pharmacist with each prescription and refill. Be sure to read this information carefully each time. Talk to your pediatrician regarding the use of this medicine in children. Special care may be needed. Elderly patients over 101 years old may have a stronger reaction and need a smaller dose. Overdosage: If  you think you have taken too much of this medicine contact a poison control center or emergency room at once. NOTE: This medicine is only for you. Do not share this medicine with others. What if I miss a dose? This does not apply. What may interact with this medicine? Do not take this medicine with any of the following medications:  cidofovir  ketorolac  methotrexate This medicine may also interact with the following medications:  alcohol  aspirin and aspirin-like medicines  cyclosporine  diuretics  lithium  medicines for blood pressure  medicines for osteoporosis  medicines that affect platelets  medicines that treat or prevent blood clots like warfarin  NSAIDs, medicines for pain and inflammation, like ibuprofen or naproxen  pemetrexed  steroid medicines like prednisone or cortisone This list may not describe all possible interactions. Give your health care provider a list of all the medicines, herbs, non-prescription drugs, or dietary supplements you use. Also tell them if you smoke, drink alcohol, or use illegal drugs. Some items may interact with your medicine. What should I watch for while using this medicine? Tell your doctor or healthcare provider if your pain does not get better. Talk to your doctor before taking another medicine for pain. Do not treat yourself. This medicine may cause serious skin reactions. They can happen weeks to months after starting the medicine. Contact your healthcare provider right away if you notice fevers or flu-like symptoms with a rash. The rash may be red or purple and then turn into blisters or peeling of the skin. Or, you might notice a red rash with swelling of the face,  lips or lymph nodes in your neck or under your arms. This medicine does not prevent heart attack or stroke. In fact, this medicine may increase the chance of a heart attack or stroke. The chance may increase with longer use of this medicine and in people who have  heart disease. If you take aspirin to prevent heart attack or stroke, talk with your doctor or healthcare provider. Do not take medicines such as ibuprofen and naproxen with this medicine. Side effects such as stomach upset, nausea, or ulcers may be more likely to occur. Many medicines available without a prescription should not be taken with this medicine. This medicine can cause ulcers and bleeding in the stomach and intestines at any time during treatment. Do not smoke cigarettes or drink alcohol. These increase irritation to your stomach and can make it more susceptible to damage from this medicine. Ulcers and bleeding can happen without warning symptoms and can cause death. You may get drowsy or dizzy. Do not drive, use machinery, or do anything that needs mental alertness until you know how this medicine affects you. Do not stand or sit up quickly, especially if you are an older patient. This reduces the risk of dizzy or fainting spells. This medicine can cause you to bleed more easily. Try to avoid damage to your teeth and gums when you brush or floss your teeth. If you take migraine medicines for 10 or more days a month, your migraines may get worse. Keep a diary of headache days and medicine use. Contact your healthcare provider if your migraine attacks occur more frequently. What side effects may I notice from receiving this medicine? Side effects that you should report to your doctor or health care professional as soon as possible:  allergic reactions like skin rash, itching or hives, swelling of the face, lips, or tongue  black or bloody stools, blood in the urine or vomit  blurred vision  chest pain  difficulty breathing or wheezing  nausea or vomiting  rash, fever, and swollen lymph nodes  redness, blistering, peeling or loosening of the skin, including inside the mouth  slurred speech or weakness on one side of the body  trouble passing urine or change in the amount of  urine  unexplained weight gain or swelling  unusually weak or tired  yellowing of eyes or skin Side effects that usually do not require medical attention (report to your doctor or health care professional if they continue or are bothersome):  constipation  diarrhea  dizziness  headache  heartburn This list may not describe all possible side effects. Call your doctor for medical advice about side effects. You may report side effects to FDA at 1-800-FDA-1088. Where should I keep my medicine? Keep out of the reach of children. Store at room temperature between 15 and 30 degrees C (59 and 86 degrees F). Throw away any unused medicine after the expiration date. NOTE: This sheet is a summary. It may not cover all possible information. If you have questions about this medicine, talk to your doctor, pharmacist, or health care provider.  2020 Elsevier/Gold Standard (2019-01-11 13:00:15) Rizatriptan disintegrating tablets What is this medicine? RIZATRIPTAN (rye za TRIP tan) is used to treat migraines with or without aura. An aura is a strange feeling or visual disturbance that warns you of an attack. It is not used to prevent migraines. This medicine may be used for other purposes; ask your health care provider or pharmacist if you have questions. COMMON BRAND NAME(S): Maxalt-MLT  What should I tell my health care provider before I take this medicine? They need to know if you have any of these conditions:  cigarette smoker  circulation problems in fingers and toes  diabetes  heart disease  high blood pressure  high cholesterol  history of irregular heartbeat  history of stroke  kidney disease  liver disease  stomach or intestine problems  an unusual or allergic reaction to rizatriptan, other medicines, foods, dyes, or preservatives  pregnant or trying to get pregnant  breast-feeding How should I use this medicine? Take this medicine by mouth. Follow the directions on  the prescription label. Leave the tablet in the sealed blister pack until you are ready to take it. With dry hands, open the blister and gently remove the tablet. If the tablet breaks or crumbles, throw it away and take a new tablet out of the blister pack. Place the tablet in the mouth and allow it to dissolve, and then swallow. Do not cut, crush, or chew this medicine. You do not need water to take this medicine. Do not take it more often than directed. Talk to your pediatrician regarding the use of this medicine in children. While this drug may be prescribed for children as young as 6 years for selected conditions, precautions do apply. Overdosage: If you think you have taken too much of this medicine contact a poison control center or emergency room at once. NOTE: This medicine is only for you. Do not share this medicine with others. What if I miss a dose? This does not apply. This medicine is not for regular use. What may interact with this medicine? Do not take this medicine with any of the following medicines:  certain medicines for migraine headache like almotriptan, eletriptan, frovatriptan, naratriptan, rizatriptan, sumatriptan, zolmitriptan  ergot alkaloids like dihydroergotamine, ergonovine, ergotamine, methylergonovine  MAOIs like Carbex, Eldepryl, Marplan, Nardil, and Parnate This medicine may also interact with the following medications:  certain medicines for depression, anxiety, or psychotic disorders  propranolol This list may not describe all possible interactions. Give your health care provider a list of all the medicines, herbs, non-prescription drugs, or dietary supplements you use. Also tell them if you smoke, drink alcohol, or use illegal drugs. Some items may interact with your medicine. What should I watch for while using this medicine? Visit your healthcare professional for regular checks on your progress. Tell your healthcare professional if your symptoms do not start  to get better or if they get worse. You may get drowsy or dizzy. Do not drive, use machinery, or do anything that needs mental alertness until you know how this medicine affects you. Do not stand up or sit up quickly, especially if you are an older patient. This reduces the risk of dizzy or fainting spells. Alcohol may interfere with the effect of this medicine. Your mouth may get dry. Chewing sugarless gum or sucking hard candy and drinking plenty of water may help. Contact your healthcare professional if the problem does not go away or is severe. If you take migraine medicines for 10 or more days a month, your migraines may get worse. Keep a diary of headache days and medicine use. Contact your healthcare professional if your migraine attacks occur more frequently. What side effects may I notice from receiving this medicine? Side effects that you should report to your doctor or health care professional as soon as possible:  allergic reactions like skin rash, itching or hives, swelling of the face, lips, or tongue  chest pain or chest tightness  signs and symptoms of a dangerous change in heartbeat or heart rhythm like chest pain; dizziness; fast, irregular heartbeat; palpitations; feeling faint or lightheaded; falls; breathing problems  signs and symptoms of a stroke like changes in vision; confusion; trouble speaking or understanding; severe headaches; sudden numbness or weakness of the face, arm or leg; trouble walking; dizziness; loss of balance or coordination  signs and symptoms of serotonin syndrome like irritable; confusion; diarrhea; fast or irregular heartbeat; muscle twitching; stiff muscles; trouble walking; sweating; high fever; seizures; chills; vomiting Side effects that usually do not require medical attention (report to your doctor or health care professional if they continue or are bothersome):  diarrhea  dizziness  drowsiness  dry mouth  headache  nausea,  vomiting  pain, tingling, numbness in the hands or feet  stomach pain This list may not describe all possible side effects. Call your doctor for medical advice about side effects. You may report side effects to FDA at 1-800-FDA-1088. Where should I keep my medicine? Keep out of the reach of children. Store at room temperature between 15 and 30 degrees C (59 and 86 degrees F). Protect from light and moisture. Throw away any unused medicine after the expiration date. NOTE: This sheet is a summary. It may not cover all possible information. If you have questions about this medicine, talk to your doctor, pharmacist, or health care provider.  2020 Elsevier/Gold Standard (2018-05-10 14:58:08) OnabotulinumtoxinA injection (Medical Use) What is this medicine? ONABOTULINUMTOXINA (o na BOTT you lye num tox in eh) is a neuro-muscular blocker. This medicine is used to treat crossed eyes, eyelid spasms, severe neck muscle spasms, ankle and toe muscle spasms, and elbow, wrist, and finger muscle spasms. It is also used to treat excessive underarm sweating, to prevent chronic migraine headaches, and to treat loss of bladder control due to neurologic conditions such as multiple sclerosis or spinal cord injury. This medicine may be used for other purposes; ask your health care provider or pharmacist if you have questions. COMMON BRAND NAME(S): Botox What should I tell my health care provider before I take this medicine? They need to know if you have any of these conditions:  breathing problems  cerebral palsy spasms  difficulty urinating  heart problems  history of surgery where this medicine is going to be used  infection at the site where this medicine is going to be used  myasthenia gravis or other neurologic disease  nerve or muscle disease  surgery plans  take medicines that treat or prevent blood clots  thyroid problems  an unusual or allergic reaction to botulinum toxin, albumin, other  medicines, foods, dyes, or preservatives  pregnant or trying to get pregnant  breast-feeding How should I use this medicine? This medicine is for injection into a muscle. It is given by a health care professional in a hospital or clinic setting. Talk to your pediatrician regarding the use of this medicine in children. While this drug may be prescribed for children as young as 52 years old for selected conditions, precautions do apply. Overdosage: If you think you have taken too much of this medicine contact a poison control center or emergency room at once. NOTE: This medicine is only for you. Do not share this medicine with others. What if I miss a dose? This does not apply. What may interact with this medicine?  aminoglycoside antibiotics like gentamicin, neomycin, tobramycin  muscle relaxants  other botulinum toxin injections This list may not  describe all possible interactions. Give your health care provider a list of all the medicines, herbs, non-prescription drugs, or dietary supplements you use. Also tell them if you smoke, drink alcohol, or use illegal drugs. Some items may interact with your medicine. What should I watch for while using this medicine? Visit your doctor for regular check ups. This medicine will cause weakness in the muscle where it is injected. Tell your doctor if you feel unusually weak in other muscles. Get medical help right away if you have problems with breathing, swallowing, or talking. This medicine might make your eyelids droop or make you see blurry or double. If you have weak muscles or trouble seeing do not drive a car, use machinery, or do other dangerous activities. This medicine contains albumin from human blood. It may be possible to pass an infection in this medicine, but no cases have been reported. Talk to your doctor about the risks and benefits of this medicine. If your activities have been limited by your condition, go back to your regular routine  slowly after treatment with this medicine. What side effects may I notice from receiving this medicine? Side effects that you should report to your doctor or health care professional as soon as possible:  allergic reactions like skin rash, itching or hives, swelling of the face, lips, or tongue  breathing problems  changes in vision  chest pain or tightness  eye irritation, pain  fast, irregular heartbeat  infection  numbness  speech problems  swallowing problems  unusual weakness Side effects that usually do not require medical attention (report to your doctor or health care professional if they continue or are bothersome):  bruising or pain at site where injected  drooping eyelid  dry eyes or mouth  headache  muscles aches, pains  sensitivity to light  tearing This list may not describe all possible side effects. Call your doctor for medical advice about side effects. You may report side effects to FDA at 1-800-FDA-1088. Where should I keep my medicine? This drug is given in a hospital or clinic and will not be stored at home. NOTE: This sheet is a summary. It may not cover all possible information. If you have questions about this medicine, talk to your doctor, pharmacist, or health care provider.  2020 Elsevier/Gold Standard (2018-05-02 14:21:42)

## 2020-08-20 NOTE — Telephone Encounter (Signed)
Botox order sheet completed. Dx: K82.060. Pt will need to sign consent at first injection appointment.

## 2020-08-23 DIAGNOSIS — E039 Hypothyroidism, unspecified: Secondary | ICD-10-CM | POA: Diagnosis not present

## 2020-08-26 DIAGNOSIS — M25511 Pain in right shoulder: Secondary | ICD-10-CM | POA: Diagnosis not present

## 2020-08-26 DIAGNOSIS — B029 Zoster without complications: Secondary | ICD-10-CM | POA: Diagnosis not present

## 2020-08-26 DIAGNOSIS — M25512 Pain in left shoulder: Secondary | ICD-10-CM | POA: Diagnosis not present

## 2020-08-29 NOTE — Telephone Encounter (Signed)
I called the patient. She has a Botox appointment on 10/26 with her doctor in [place]. She will keep this appointment and transfer care to [place] after her Botox injection. She is aware that her next Botox injection will be in January at our office. I advised her that I will call her insurance after the 26th and see what I need to do as far as prior authorization. FYI

## 2020-08-30 DIAGNOSIS — Z8349 Family history of other endocrine, nutritional and metabolic diseases: Secondary | ICD-10-CM | POA: Diagnosis not present

## 2020-08-30 DIAGNOSIS — E041 Nontoxic single thyroid nodule: Secondary | ICD-10-CM | POA: Diagnosis not present

## 2020-08-30 DIAGNOSIS — E039 Hypothyroidism, unspecified: Secondary | ICD-10-CM | POA: Diagnosis not present

## 2020-09-03 DIAGNOSIS — N921 Excessive and frequent menstruation with irregular cycle: Secondary | ICD-10-CM | POA: Diagnosis not present

## 2020-09-03 DIAGNOSIS — G43909 Migraine, unspecified, not intractable, without status migrainosus: Secondary | ICD-10-CM | POA: Diagnosis not present

## 2020-09-05 NOTE — Telephone Encounter (Signed)
I called [employer] [place] and spoke to [place] to see if PA is needed for [phone] and [phone]. She states PA is required, however patient has a PA for Botox on file. I advised that patient is currently getting Botox at a different practice, but needing to transfer care to our office. Sherri states that PA is not physician specific or site specific, so patient may use the PA on file. PA #[id number] (07/12/20- 01/07/21). In the future, PA is done by [facility] [phone]).

## 2020-09-05 NOTE — Telephone Encounter (Signed)
I called the patient and let her know. We scheduled her next Botox appointment for 12/03/20.

## 2020-09-18 DIAGNOSIS — D500 Iron deficiency anemia secondary to blood loss (chronic): Secondary | ICD-10-CM | POA: Diagnosis not present

## 2020-09-18 DIAGNOSIS — N921 Excessive and frequent menstruation with irregular cycle: Secondary | ICD-10-CM | POA: Diagnosis not present

## 2020-09-20 DIAGNOSIS — N921 Excessive and frequent menstruation with irregular cycle: Secondary | ICD-10-CM | POA: Diagnosis not present

## 2020-09-24 DIAGNOSIS — Z01810 Encounter for preprocedural cardiovascular examination: Secondary | ICD-10-CM | POA: Diagnosis not present

## 2020-09-24 DIAGNOSIS — N921 Excessive and frequent menstruation with irregular cycle: Secondary | ICD-10-CM | POA: Diagnosis not present

## 2020-09-25 DIAGNOSIS — Z01818 Encounter for other preprocedural examination: Secondary | ICD-10-CM | POA: Diagnosis not present

## 2020-09-25 DIAGNOSIS — N921 Excessive and frequent menstruation with irregular cycle: Secondary | ICD-10-CM | POA: Diagnosis not present

## 2020-09-25 DIAGNOSIS — Z01810 Encounter for preprocedural cardiovascular examination: Secondary | ICD-10-CM | POA: Diagnosis not present

## 2020-09-26 DIAGNOSIS — D500 Iron deficiency anemia secondary to blood loss (chronic): Secondary | ICD-10-CM | POA: Diagnosis not present

## 2020-09-26 DIAGNOSIS — N921 Excessive and frequent menstruation with irregular cycle: Secondary | ICD-10-CM | POA: Diagnosis not present

## 2020-09-26 DIAGNOSIS — N920 Excessive and frequent menstruation with regular cycle: Secondary | ICD-10-CM | POA: Diagnosis not present

## 2020-09-26 DIAGNOSIS — D649 Anemia, unspecified: Secondary | ICD-10-CM | POA: Diagnosis not present

## 2020-10-08 DIAGNOSIS — Z4889 Encounter for other specified surgical aftercare: Secondary | ICD-10-CM | POA: Diagnosis not present

## 2020-10-10 DIAGNOSIS — D500 Iron deficiency anemia secondary to blood loss (chronic): Secondary | ICD-10-CM | POA: Diagnosis not present

## 2020-10-10 DIAGNOSIS — Z13220 Encounter for screening for lipoid disorders: Secondary | ICD-10-CM | POA: Diagnosis not present

## 2020-10-10 DIAGNOSIS — Z0000 Encounter for general adult medical examination without abnormal findings: Secondary | ICD-10-CM | POA: Diagnosis not present

## 2020-10-10 DIAGNOSIS — E559 Vitamin D deficiency, unspecified: Secondary | ICD-10-CM | POA: Diagnosis not present

## 2020-10-25 DIAGNOSIS — R002 Palpitations: Secondary | ICD-10-CM | POA: Diagnosis not present

## 2020-11-18 NOTE — Telephone Encounter (Signed)
Patient called and LVM over the weekend to see if she could have a sooner Botox appointment than 1/25. I returned patient's call and LVM advising patient that due to the date of her last injection at her previous doctor's office, she can't be scheduled any sooner. We must keep it as close as possible to 90 days between injections or insurance may not pay. Advised patient to call me back with any questions.

## 2020-11-27 DIAGNOSIS — I491 Atrial premature depolarization: Secondary | ICD-10-CM | POA: Diagnosis not present

## 2020-11-28 NOTE — Telephone Encounter (Signed)
Patient's Botox appointment is coming up on 1/25. I called [facility] [phone]) and spoke with [place] to double check patient's PA status for Botox. Suzette Battiest states that because PA on file for Botox is with another office, patient's appointment at our office would interfere with the PA. She states we would need to initiate a new PA, and began the process. I provided her with clinical information and faxed records to [phone]). She states patient will be B/B under this PA. Tracking #[id number].

## 2020-12-02 NOTE — Telephone Encounter (Signed)
I called [facility] [phone]) and spoke with Nicole Kindred to check if any other information is needed. Nicole Kindred states that the case is still under clinical review. I requested to speak to a reviewer. She transferred me to [facility], who reviewed the fax I sent this morning. She states that they have all of the information they need to make a decision. She states we will receive a fax.

## 2020-12-02 NOTE — Progress Notes (Signed)
Consent Form Botulism Toxin Injection For Chronic Migraine  12/02/2020: this is our first botox. Over the last year or longer daily headaches 16- 20 migraine days a month. She had [facility] do the first botox and she said she had pain in the right occipital area and her shoulders didn;t feel right and she didn;t want to do that again. I encouraged her to try it once more, I am afraid if we do not inject those areas the botox will not be as effective for her migraines as she did get >50% improvement after that first botox injection. I did the cervical muscles very shallow and did not put extra there and did the entire protocol +5U each masseters, +5U each orbic occuli. Also 2U above the right eye as she had a "spock" brow and felt more movement there and this caused headaches on the right. Ask her to see if it felt more even the way we did it. She loved cambia.   Reviewed orally with patient, additionally signature is on file:  Botulism toxin has been approved by the Federal drug administration for treatment of chronic migraine. Botulism toxin does not cure chronic migraine and it may not be effective in some patients.  The administration of botulism toxin is accomplished by injecting a small amount of toxin into the muscles of the neck and head. Dosage must be titrated for each individual. Any benefits resulting from botulism toxin tend to wear off after 3 months with a repeat injection required if benefit is to be maintained. Injections are usually done every 3-4 months with maximum effect peak achieved by about 2 or 3 weeks. Botulism toxin is expensive and you should be sure of what costs you will incur resulting from the injection.  The side effects of botulism toxin use for chronic migraine may include:   -Transient, and usually mild, facial weakness with facial injections  -Transient, and usually mild, head or neck weakness with head/neck injections  -Reduction or loss of forehead facial  animation due to forehead muscle weakness  -Eyelid drooping  -Dry eye  -Pain at the site of injection or bruising at the site of injection  -Double vision  -Potential unknown long term risks  Contraindications: You should not have Botox if you are pregnant, nursing, allergic to albumin, have an infection, skin condition, or muscle weakness at the site of the injection, or have myasthenia gravis, Lambert-Eaton syndrome, or ALS.  It is also possible that as with any injection, there may be an allergic reaction or no effect from the medication. Reduced effectiveness after repeated injections is sometimes seen and rarely infection at the injection site may occur. All care will be taken to prevent these side effects. If therapy is given over a long time, atrophy and wasting in the muscle injected may occur. Occasionally the patient's become refractory to treatment because they develop antibodies to the toxin. In this event, therapy needs to be modified.  I have read the above information and consent to the administration of botulism toxin.    BOTOX PROCEDURE NOTE FOR MIGRAINE HEADACHE    Contraindications and precautions discussed with patient(above). Aseptic procedure was observed and patient tolerated procedure. Procedure performed by Dr. Artemio Aly  The condition has existed for more than 6 months, and pt does not have a diagnosis of ALS, Myasthenia Gravis or Lambert-Eaton Syndrome.  Risks and benefits of injections discussed and pt agrees to proceed with the procedure.  Written consent obtained  These injections are medically  necessary. Pt  receives good benefits from these injections. These injections do not cause sedations or hallucinations which the oral therapies may cause.  Description of procedure:  The patient was placed in a sitting position. The standard protocol was used for Botox as follows, with 5 units of Botox injected at each site:   -Procerus muscle, midline  injection  -Corrugator muscle, bilateral injection  -Frontalis muscle, bilateral injection, with 2 sites each side, medial injection was performed in the upper one third of the frontalis muscle, in the region vertical from the medial inferior edge of the superior orbital rim. The lateral injection was again in the upper one third of the forehead vertically above the lateral limbus of the cornea, 1.5 cm lateral to the medial injection site.  -Temporalis muscle injection, 4 sites, bilaterally. The first injection was 3 cm above the tragus of the ear, second injection site was 1.5 cm to 3 cm up from the first injection site in line with the tragus of the ear. The third injection site was 1.5-3 cm forward between the first 2 injection sites. The fourth injection site was 1.5 cm posterior to the second injection site.   -Occipitalis muscle injection, 3 sites, bilaterally. The first injection was done one half way between the occipital protuberance and the tip of the mastoid process behind the ear. The second injection site was done lateral and superior to the first, 1 fingerbreadth from the first injection. The third injection site was 1 fingerbreadth superiorly and medially from the first injection site.  -Cervical paraspinal muscle injection, 2 sites, bilateral knee first injection site was 1 cm from the midline of the cervical spine, 3 cm inferior to the lower border of the occipital protuberance. The second injection site was 1.5 cm superiorly and laterally to the first injection site.  -Trapezius muscle injection was performed at 3 sites, bilaterally. The first injection site was in the upper trapezius muscle halfway between the inflection point of the neck, and the acromion. The second injection site was one half way between the acromion and the first injection site. The third injection was done between the first injection site and the inflection point of the neck.   Will return for repeat injection  in 3 months.   200 units of Botox was used, any Botox not injected was wasted. The patient tolerated the procedure well, there were no complications of the above procedure.

## 2020-12-02 NOTE — Telephone Encounter (Signed)
Received request for additional information via fax. Faxed in medical records to [facility] at [phone] including current medication list, as well as Botox dosage needed. Included office phone number if there are any questions.

## 2020-12-03 ENCOUNTER — Ambulatory Visit: Payer: BC Managed Care – PPO | Admitting: Neurology

## 2020-12-03 DIAGNOSIS — G43709 Chronic migraine without aura, not intractable, without status migrainosus: Secondary | ICD-10-CM

## 2020-12-03 NOTE — Telephone Encounter (Signed)
Received approval from [facility] for Botox. PA #[id number] (12/03/20- 05/31/21). Approval is for 3 doses, total of 600 units.

## 2020-12-03 NOTE — Progress Notes (Signed)
Botox consent signed Botox- 200 units x 1 vial Lot: [id number] Expiration: 08/2023 NDC: 0932-6712-45  Bacteriostatic 0.9% Sodium Chloride- 43mL total Lot: [id number] Expiration: 12/10/2021 NDC: 3825-0539-76  Dx: B34.193 B/B

## 2020-12-11 DIAGNOSIS — H11423 Conjunctival edema, bilateral: Secondary | ICD-10-CM | POA: Diagnosis not present

## 2020-12-24 ENCOUNTER — Other Ambulatory Visit: Payer: Self-pay | Admitting: Allergy

## 2021-01-17 DIAGNOSIS — D500 Iron deficiency anemia secondary to blood loss (chronic): Secondary | ICD-10-CM | POA: Diagnosis not present

## 2021-01-17 DIAGNOSIS — R002 Palpitations: Secondary | ICD-10-CM | POA: Diagnosis not present

## 2021-01-17 DIAGNOSIS — K2100 Gastro-esophageal reflux disease with esophagitis, without bleeding: Secondary | ICD-10-CM | POA: Diagnosis not present

## 2021-01-17 DIAGNOSIS — N951 Menopausal and female climacteric states: Secondary | ICD-10-CM | POA: Diagnosis not present

## 2021-02-03 ENCOUNTER — Telehealth: Payer: Self-pay | Admitting: Neurology

## 2021-02-03 NOTE — Telephone Encounter (Signed)
Pt has had a migraine for 4 days and would like to know what could be suggested for her

## 2021-02-03 NOTE — Telephone Encounter (Signed)
Called patient who stated she has requested  refill for cambia, has taken Zipsor today which has helped dull the headache. Cambia typically works well for her; she just ran out. She stated headache may be sinus related, and she gets bits of relief but it comes back. She has also been taking Tylenol, Ibuprofen. I advised she be careful not to get rebound headache. Call PCP if she thinks she has a sinus issue , infection. I advised will send message to Dr Lucia Gaskins. Patient verbalized understanding, appreciation.

## 2021-02-04 NOTE — Telephone Encounter (Signed)
Valli Glance, can you see if megan or amy have any availabiity for a video or office appointment this week to discuss with patient?

## 2021-02-05 ENCOUNTER — Telehealth: Payer: Self-pay | Admitting: Neurology

## 2021-02-05 NOTE — Telephone Encounter (Signed)
Called pt to offer appointment with NP, she states that her migraine has improved since using medication and doing saline rinses. She states she is ok for now and does not feel she needs to come in, but will give [place] a call if anything changes.

## 2021-02-05 NOTE — Telephone Encounter (Signed)
Patient stated her migraine resolved, she did not want to come for an appointment, she will wait until botox

## 2021-02-14 ENCOUNTER — Other Ambulatory Visit: Payer: Self-pay | Admitting: Family Medicine

## 2021-02-14 ENCOUNTER — Other Ambulatory Visit: Payer: Self-pay

## 2021-02-14 ENCOUNTER — Ambulatory Visit
Admission: RE | Admit: 2021-02-14 | Discharge: 2021-02-14 | Disposition: A | Discharge status: Home / Self Care | Payer: BC Managed Care – PPO | Source: Ambulatory Visit | Attending: Family Medicine | Admitting: Family Medicine

## 2021-02-14 DIAGNOSIS — Z789 Other specified health status: Secondary | ICD-10-CM

## 2021-02-14 DIAGNOSIS — M47812 Spondylosis without myelopathy or radiculopathy, cervical region: Secondary | ICD-10-CM | POA: Diagnosis not present

## 2021-02-14 DIAGNOSIS — R202 Paresthesia of skin: Secondary | ICD-10-CM | POA: Diagnosis not present

## 2021-02-14 DIAGNOSIS — R200 Anesthesia of skin: Secondary | ICD-10-CM | POA: Diagnosis not present

## 2021-03-03 ENCOUNTER — Ambulatory Visit: Payer: BC Managed Care – PPO | Admitting: Neurology

## 2021-03-04 ENCOUNTER — Other Ambulatory Visit: Payer: Self-pay | Admitting: Physician Assistant

## 2021-03-04 ENCOUNTER — Ambulatory Visit: Payer: BC Managed Care – PPO | Admitting: Neurology

## 2021-03-04 DIAGNOSIS — R1011 Right upper quadrant pain: Secondary | ICD-10-CM

## 2021-03-04 DIAGNOSIS — G43709 Chronic migraine without aura, not intractable, without status migrainosus: Secondary | ICD-10-CM | POA: Diagnosis not present

## 2021-03-04 DIAGNOSIS — Z1211 Encounter for screening for malignant neoplasm of colon: Secondary | ICD-10-CM | POA: Diagnosis not present

## 2021-03-04 DIAGNOSIS — K2100 Gastro-esophageal reflux disease with esophagitis, without bleeding: Secondary | ICD-10-CM | POA: Diagnosis not present

## 2021-03-04 MED ORDER — DICLOFENAC POTASSIUM(MIGRAINE) 50 MG PO PACK: 50.0000 mg | PACK | Freq: Two times a day (BID) | ORAL | 11 refills | Status: AC | PRN

## 2021-03-04 NOTE — Progress Notes (Signed)
Consent Form Botulism Toxin Injection For Chronic Migraine   03/04/2021: Patient is doing excellent, for 2 months she barely had a headache, Botox started wearing off a little bit in the last few weeks but this is normal, and this is her third Botox we should expect maximum efficacy after this and hopefully will last a little longer.  We changed it up a little bit, +7 units in each masseter and procerus because she felt that her headache started there, +5 each orbicularis oculi, we did not put any in the masseters she is not sure if that helped, we did not put 2 extra units over her right eye, she felt this past time the headaches were more in the left sides put some additional Botox in the left temporal parietal areas, we did do the whole protocol, we did not put any extra in the traps or cervical paraspinals and stayed very superficial.  Gave her a refill of Cambia because she loves it.  12/02/2020: this is our first botox. Over the last year or longer daily headaches 16- 20 migraine days a month. She had [place] neuro do the first botox and she said she had pain in the right occipital area and her shoulders didn;t feel right and she didn;t want to do that again. I encouraged her to try it once more, I am afraid if we do not inject those areas the botox will not be as effective for her migraines as she did get >50% improvement after that first botox injection. I did the cervical muscles very shallow and did not put extra there and did the entire protocol +5U each masseters, +5U each orbic occuli. Also 2U above the right eye as she had a "spock" brow and felt more movement there and this caused headaches on the right. Ask her to see if it felt more even the way we did it. She loved cambia.   Reviewed orally with patient, additionally signature is on file:  Botulism toxin has been approved by the Federal drug administration for treatment of chronic migraine. Botulism toxin does not cure chronic migraine and  it may not be effective in some patients.  The administration of botulism toxin is accomplished by injecting a small amount of toxin into the muscles of the neck and head. Dosage must be titrated for each individual. Any benefits resulting from botulism toxin tend to wear off after 3 months with a repeat injection required if benefit is to be maintained. Injections are usually done every 3-4 months with maximum effect peak achieved by about 2 or 3 weeks. Botulism toxin is expensive and you should be sure of what costs you will incur resulting from the injection.  The side effects of botulism toxin use for chronic migraine may include:   -Transient, and usually mild, facial weakness with facial injections  -Transient, and usually mild, head or neck weakness with head/neck injections  -Reduction or loss of forehead facial animation due to forehead muscle weakness  -Eyelid drooping  -Dry eye  -Pain at the site of injection or bruising at the site of injection  -Double vision  -Potential unknown long term risks  Contraindications: You should not have Botox if you are pregnant, nursing, allergic to albumin, have an infection, skin condition, or muscle weakness at the site of the injection, or have myasthenia gravis, Lambert-Eaton syndrome, or ALS.  It is also possible that as with any injection, there may be an allergic reaction or no effect from the medication. Reduced  effectiveness after repeated injections is sometimes seen and rarely infection at the injection site may occur. All care will be taken to prevent these side effects. If therapy is given over a long time, atrophy and wasting in the muscle injected may occur. Occasionally the patient's become refractory to treatment because they develop antibodies to the toxin. In this event, therapy needs to be modified.  I have read the above information and consent to the administration of botulism toxin.    BOTOX PROCEDURE NOTE FOR MIGRAINE  HEADACHE    Contraindications and precautions discussed with patient(above). Aseptic procedure was observed and patient tolerated procedure. Procedure performed by Dr. Artemio Aly  The condition has existed for more than 6 months, and pt does not have a diagnosis of ALS, Myasthenia Gravis or Lambert-Eaton Syndrome.  Risks and benefits of injections discussed and pt agrees to proceed with the procedure.  Written consent obtained  These injections are medically necessary. Pt  receives good benefits from these injections. These injections do not cause sedations or hallucinations which the oral therapies may cause.  Description of procedure:  The patient was placed in a sitting position. The standard protocol was used for Botox as follows, with 5 units of Botox injected at each site:   -Procerus muscle, midline injection  -Corrugator muscle, bilateral injection  -Frontalis muscle, bilateral injection, with 2 sites each side, medial injection was performed in the upper one third of the frontalis muscle, in the region vertical from the medial inferior edge of the superior orbital rim. The lateral injection was again in the upper one third of the forehead vertically above the lateral limbus of the cornea, 1.5 cm lateral to the medial injection site.  -Temporalis muscle injection, 4 sites, bilaterally. The first injection was 3 cm above the tragus of the ear, second injection site was 1.5 cm to 3 cm up from the first injection site in line with the tragus of the ear. The third injection site was 1.5-3 cm forward between the first 2 injection sites. The fourth injection site was 1.5 cm posterior to the second injection site.   -Occipitalis muscle injection, 3 sites, bilaterally. The first injection was done one half way between the occipital protuberance and the tip of the mastoid process behind the ear. The second injection site was done lateral and superior to the first, 1 fingerbreadth from the first  injection. The third injection site was 1 fingerbreadth superiorly and medially from the first injection site.  -Cervical paraspinal muscle injection, 2 sites, bilateral knee first injection site was 1 cm from the midline of the cervical spine, 3 cm inferior to the lower border of the occipital protuberance. The second injection site was 1.5 cm superiorly and laterally to the first injection site.  -Trapezius muscle injection was performed at 3 sites, bilaterally. The first injection site was in the upper trapezius muscle halfway between the inflection point of the neck, and the acromion. The second injection site was one half way between the acromion and the first injection site. The third injection was done between the first injection site and the inflection point of the neck.   Will return for repeat injection in 3 months.   200 units of Botox was used, any Botox not injected was wasted. The patient tolerated the procedure well, there were no complications of the above procedure.

## 2021-03-04 NOTE — Progress Notes (Signed)
Botox- 200 units x 1 vial Lot: [id number] Expiration: 11/2023 NDC: 7062-3762-83  Bacteriostatic 0.9% Sodium Chloride- 11mL total  Dx: T51.761  B/B

## 2021-03-07 DIAGNOSIS — R202 Paresthesia of skin: Secondary | ICD-10-CM | POA: Diagnosis not present

## 2021-03-11 DIAGNOSIS — K2289 Other specified disease of esophagus: Secondary | ICD-10-CM | POA: Diagnosis not present

## 2021-03-11 DIAGNOSIS — R12 Heartburn: Secondary | ICD-10-CM | POA: Diagnosis not present

## 2021-03-11 DIAGNOSIS — R110 Nausea: Secondary | ICD-10-CM | POA: Diagnosis not present

## 2021-03-11 DIAGNOSIS — K2930 Chronic superficial gastritis without bleeding: Secondary | ICD-10-CM | POA: Diagnosis not present

## 2021-03-12 DIAGNOSIS — R110 Nausea: Secondary | ICD-10-CM | POA: Diagnosis not present

## 2021-03-12 DIAGNOSIS — M545 Low back pain, unspecified: Secondary | ICD-10-CM | POA: Diagnosis not present

## 2021-03-17 DIAGNOSIS — R202 Paresthesia of skin: Secondary | ICD-10-CM | POA: Diagnosis not present

## 2021-03-20 ENCOUNTER — Ambulatory Visit
Admission: RE | Admit: 2021-03-20 | Discharge: 2021-03-20 | Disposition: A | Discharge status: Home / Self Care | Payer: BC Managed Care – PPO | Source: Ambulatory Visit | Attending: Physician Assistant | Admitting: Physician Assistant

## 2021-03-20 DIAGNOSIS — K7689 Other specified diseases of liver: Secondary | ICD-10-CM | POA: Diagnosis not present

## 2021-03-20 DIAGNOSIS — R1011 Right upper quadrant pain: Secondary | ICD-10-CM

## 2021-03-21 DIAGNOSIS — R202 Paresthesia of skin: Secondary | ICD-10-CM | POA: Diagnosis not present

## 2021-03-24 ENCOUNTER — Other Ambulatory Visit: Payer: BC Managed Care – PPO

## 2021-03-25 DIAGNOSIS — R202 Paresthesia of skin: Secondary | ICD-10-CM | POA: Diagnosis not present

## 2021-03-26 ENCOUNTER — Other Ambulatory Visit: Payer: Self-pay | Admitting: Physician Assistant

## 2021-03-26 DIAGNOSIS — R1011 Right upper quadrant pain: Secondary | ICD-10-CM

## 2021-03-27 DIAGNOSIS — R202 Paresthesia of skin: Secondary | ICD-10-CM | POA: Diagnosis not present

## 2021-03-28 DIAGNOSIS — G8929 Other chronic pain: Secondary | ICD-10-CM | POA: Diagnosis not present

## 2021-03-28 DIAGNOSIS — M546 Pain in thoracic spine: Secondary | ICD-10-CM | POA: Diagnosis not present

## 2021-03-28 DIAGNOSIS — R1011 Right upper quadrant pain: Secondary | ICD-10-CM | POA: Diagnosis not present

## 2021-03-31 ENCOUNTER — Other Ambulatory Visit: Payer: Self-pay | Admitting: Physician Assistant

## 2021-03-31 DIAGNOSIS — R1011 Right upper quadrant pain: Secondary | ICD-10-CM

## 2021-03-31 DIAGNOSIS — K7689 Other specified diseases of liver: Secondary | ICD-10-CM

## 2021-04-03 ENCOUNTER — Other Ambulatory Visit: Payer: BC Managed Care – PPO

## 2021-04-04 ENCOUNTER — Emergency Department (HOSPITAL_BASED_OUTPATIENT_CLINIC_OR_DEPARTMENT_OTHER): Payer: BC Managed Care – PPO

## 2021-04-04 ENCOUNTER — Encounter (HOSPITAL_BASED_OUTPATIENT_CLINIC_OR_DEPARTMENT_OTHER): Payer: Self-pay | Admitting: *Deleted

## 2021-04-04 ENCOUNTER — Emergency Department (HOSPITAL_BASED_OUTPATIENT_CLINIC_OR_DEPARTMENT_OTHER)
Admission: EM | Admit: 2021-04-04 | Discharge: 2021-04-04 | Disposition: A | Discharge status: Home / Self Care | Payer: BC Managed Care – PPO | Attending: Emergency Medicine | Admitting: Emergency Medicine

## 2021-04-04 ENCOUNTER — Other Ambulatory Visit: Payer: Self-pay

## 2021-04-04 DIAGNOSIS — R109 Unspecified abdominal pain: Secondary | ICD-10-CM | POA: Insufficient documentation

## 2021-04-04 DIAGNOSIS — M545 Low back pain, unspecified: Secondary | ICD-10-CM | POA: Diagnosis not present

## 2021-04-04 DIAGNOSIS — R110 Nausea: Secondary | ICD-10-CM | POA: Insufficient documentation

## 2021-04-04 DIAGNOSIS — Z87891 Personal history of nicotine dependence: Secondary | ICD-10-CM | POA: Insufficient documentation

## 2021-04-04 DIAGNOSIS — M549 Dorsalgia, unspecified: Secondary | ICD-10-CM | POA: Diagnosis not present

## 2021-04-04 DIAGNOSIS — K7689 Other specified diseases of liver: Secondary | ICD-10-CM | POA: Diagnosis not present

## 2021-04-04 LAB — CBC WITH DIFFERENTIAL/PLATELET
Abs Immature Granulocytes: 0.01 10*3/uL (ref 0.00–0.07)
Basophils Absolute: 0 10*3/uL (ref 0.0–0.1)
Basophils Relative: 1 %
Eosinophils Absolute: 0.1 10*3/uL (ref 0.0–0.5)
Eosinophils Relative: 1 %
HCT: 45.3 % (ref 36.0–46.0)
Hemoglobin: 15 g/dL (ref 12.0–15.0)
Immature Granulocytes: 0 %
Lymphocytes Relative: 36 %
Lymphs Abs: 2.3 10*3/uL (ref 0.7–4.0)
MCH: 31.4 pg (ref 26.0–34.0)
MCHC: 33.1 g/dL (ref 30.0–36.0)
MCV: 94.8 fL (ref 80.0–100.0)
Monocytes Absolute: 0.4 10*3/uL (ref 0.1–1.0)
Monocytes Relative: 7 %
Neutro Abs: 3.6 10*3/uL (ref 1.7–7.7)
Neutrophils Relative %: 55 %
Platelets: 250 10*3/uL (ref 150–400)
RBC: 4.78 MIL/uL (ref 3.87–5.11)
RDW: 12.5 % (ref 11.5–15.5)
WBC: 6.4 10*3/uL (ref 4.0–10.5)
nRBC: 0 % (ref 0.0–0.2)

## 2021-04-04 LAB — URINALYSIS, ROUTINE W REFLEX MICROSCOPIC
Bilirubin Urine: NEGATIVE
Glucose, UA: NEGATIVE mg/dL
Hgb urine dipstick: NEGATIVE
Ketones, ur: NEGATIVE mg/dL
Leukocytes,Ua: NEGATIVE
Nitrite: NEGATIVE
Protein, ur: NEGATIVE mg/dL
Specific Gravity, Urine: <1.005 — ABNORMAL LOW (ref 1.005–1.030)
pH: 6.5 (ref 5.0–8.0)

## 2021-04-04 LAB — COMPREHENSIVE METABOLIC PANEL
ALT: 11 U/L (ref 0–44)
AST: 14 U/L — ABNORMAL LOW (ref 15–41)
Albumin: 4.6 g/dL (ref 3.5–5.0)
Alkaline Phosphatase: 57 U/L (ref 38–126)
Anion gap: 8 (ref 5–15)
BUN: 8 mg/dL (ref 6–20)
CO2: 28 mmol/L (ref 22–32)
Calcium: 9.2 mg/dL (ref 8.9–10.3)
Chloride: 101 mmol/L (ref 98–111)
Creatinine, Ser: 0.77 mg/dL (ref 0.44–1.00)
GFR, Estimated: >60 mL/min (ref >60)
Glucose, Bld: 71 mg/dL (ref 70–99)
Potassium: 3.6 mmol/L (ref 3.5–5.1)
Sodium: 137 mmol/L (ref 135–145)
Total Bilirubin: 0.6 mg/dL (ref 0.3–1.2)
Total Protein: 7.5 g/dL (ref 6.5–8.1)

## 2021-04-04 LAB — LIPASE, BLOOD: Lipase: 39 U/L (ref 11–51)

## 2021-04-04 MED ORDER — HYDROCODONE-ACETAMINOPHEN 5-325 MG PO TABS
2.0000 | ORAL_TABLET | Freq: Once | ORAL | Status: AC
  Administered 2021-04-04: 2 via ORAL
  Filled 2021-04-04: qty 2

## 2021-04-04 MED ORDER — HYDROCODONE-ACETAMINOPHEN 5-325 MG PO TABS: 1.0000 | ORAL_TABLET | ORAL | 0 refills | Status: AC | PRN

## 2021-04-04 NOTE — Discharge Instructions (Addendum)
Follow-up with your primary care doctor regarding an MRI of your spine.  Return here as needed if you have any worsening symptoms.

## 2021-04-04 NOTE — ED Notes (Signed)
Culture sent with UA 

## 2021-04-04 NOTE — ED Triage Notes (Addendum)
  Rt side Mid back pain for 8-9 weeks. She has been worked up by GI to [facility]. At present time scheduled for MRI next Friday. Pressure increases the pain.

## 2021-04-04 NOTE — ED Provider Notes (Signed)
[facility] EMERGENCY DEPT Provider Note   CSN: [id number] Arrival date & time: 04/04/21  3086     History Chief Complaint  Patient presents with  . Back Pain    Theresa Black is a 45 y.o. female.  Patient is a 45 year old female who presents with back pain.  She has had some pain in her right mid back for about the last 8 to 9 weeks.  She denies any known injury.  She says is progressively gotten worse.  At times it radiates to her right flank.  No other radiation.  There is no pain in her legs.  No numbness or weakness in her extremities.  No loss of bowel or bladder control.  She says it is worse when she is laying down or sitting.  It is better when she is up walking.  She does have nausea associated with the pain.  When she lays down, she immediately gets nauseated.  There is been no vomiting.  She has seen GI with [facility] physicians and had a gallbladder ultrasound.  She is scheduled to have an MRI of her back in 1 week.  She says the pain is gotten to the point where she does not feel like she can wait.  There was talk of doing a CT scan of her abdomen but they were going to do the MRI first and then potentially do that.        Past Medical History:  Diagnosis Date  . Eczema   . Migraines   . Shingles 12/2019  . Urticaria     Patient Active Problem List   Diagnosis Date Noted  . Chronic migraine without aura without status migrainosus, not intractable 08/20/2020  . Encounter for screening for diseases of the blood and blood-forming organs and certain disorders involving the immune mechanism 06/24/2020  . History of shingles 06/24/2020  . History of wheezing 06/24/2020  . History of urticaria 06/24/2020  . Chronic rhinitis 06/24/2020  . Adverse food reaction 06/24/2020  . 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 CESAREAN SECTION    . SINOSCOPY    . WISDOM TOOTH EXTRACTION       OB History   No  obstetric history on file.     Family History  Problem Relation Age of Onset  . Hashimoto's thyroiditis Mother     Social History   Tobacco Use  . Smoking status: Former Smoker    Quit date: 2004    Years since quitting: 18.4  . Smokeless tobacco: Never Used  Vaping Use  . Vaping Use: Never used  Substance Use Topics  . Alcohol use: Yes    Comment: on occasion  . Drug use: Never    Home Medications Prior to Admission medications   Medication Sig Start Date End Date Taking? Authorizing Provider  HYDROcodone-acetaminophen (NORCO/VICODIN) 5-325 MG tablet Take 1-2 tablets by mouth every 4 (four) hours as needed. 04/04/21  Yes Rolan Bucco, MD  Acetaminophen (TYLENOL EXTRA STRENGTH PO) Take by mouth.    [provider]  Diclofenac Potassium (ZIPSOR) 25 MG CAPS Take by mouth.    [provider]  Diclofenac Potassium,Migraine, 50 MG PACK Take 50 mg by mouth 2 (two) times daily as needed. For migraine. 03/04/21   Anson Fret, MD  FLUoxetine (PROZAC) 40 MG capsule Take 40 mg by mouth daily.    [provider]  ibuprofen (ADVIL) 800 MG tablet  01/18/14   [provider]  loratadine (CLARITIN) 10 MG tablet Take 10 mg by mouth daily.    [provider]  TIROSINT 88 MCG CAPS Take 1 capsule by mouth daily. 08/17/20   [provider]    Allergies    Amoxicillin, Sulfamethoxazole-trimethoprim, Aimovig [erenumab-aooe], Levonorgestrel, Orphenadrine citrate, Other, and Valtrex [valacyclovir]  Review of Systems   Review of Systems  Constitutional: Negative for chills, diaphoresis, fatigue and fever.  HENT: Negative for congestion, rhinorrhea and sneezing.   Eyes: Negative.   Respiratory: Negative for cough, chest tightness and shortness of breath.   Cardiovascular: Negative for chest pain and leg swelling.  Gastrointestinal: Positive for nausea. Negative for abdominal pain, blood in stool, diarrhea and vomiting.  Genitourinary:  Positive for flank pain. Negative for difficulty urinating, frequency and hematuria.  Musculoskeletal: Positive for back pain. Negative for arthralgias.  Skin: Negative for rash.  Neurological: Negative for dizziness, speech difficulty, weakness, numbness and headaches.    Physical Exam Updated Vital Signs BP 104/82   Pulse 84   Temp 98.3 F (36.8 C) (Oral)   Resp 18   Ht 5\' 8"  (1.727 m)   Wt 81.6 kg   SpO2 97%   BMI 27.37 kg/m   Physical Exam Constitutional:      Appearance: She is well-developed.  HENT:     Head: Normocephalic and atraumatic.  Eyes:     Pupils: Pupils are equal, round, and reactive to light.  Cardiovascular:     Rate and Rhythm: Normal rate and regular rhythm.     Heart sounds: Normal heart sounds.  Pulmonary:     Effort: Pulmonary effort is normal. No respiratory distress.     Breath sounds: Normal breath sounds. No wheezing or rales.  Chest:     Chest wall: No tenderness.  Abdominal:     General: Bowel sounds are normal.     Palpations: Abdomen is soft.     Tenderness: There is no abdominal tenderness. There is no guarding or rebound.  Musculoskeletal:        General: Normal range of motion.     Cervical back: Normal range of motion and neck supple.     Comments: Positive tenderness in the right mid back.  There is no spinal tenderness.  No step-offs or deformities.  No rashes.  Pedal pulses are intact.  Lymphadenopathy:     Cervical: No cervical adenopathy.  Skin:    General: Skin is warm and dry.     Findings: No rash.  Neurological:     General: No focal deficit present.     Mental Status: She is alert and oriented to person, place, and time.     Comments: Motor 5 out of 5 all extremities, sensation grossly intact to light touch all extremities     ED Results / Procedures / Treatments   Labs (all labs ordered are listed, but only abnormal results are displayed) Labs Reviewed  COMPREHENSIVE METABOLIC PANEL - Abnormal; Notable for the  following components:      Result Value   AST 14 (*)    All other components within normal limits  URINALYSIS, ROUTINE W REFLEX MICROSCOPIC - Abnormal; Notable for the following components:   Color, Urine COLORLESS (*)    Specific Gravity, Urine <1.005 (*)    All other components within normal limits  LIPASE, BLOOD  CBC WITH DIFFERENTIAL/PLATELET    EKG None  Radiology CT Renal Stone Study  Result Date: 04/04/2021 CLINICAL DATA:  Flank pain EXAM: CT ABDOMEN  AND PELVIS WITHOUT CONTRAST TECHNIQUE: Multidetector CT imaging of the abdomen and pelvis was performed following the standard protocol without IV contrast. COMPARISON:  None. FINDINGS: Lower chest: No acute abnormality. Hepatobiliary: At least 5 scattered low-attenuation lesions, the largest up to 2 cm measures fluid attenuation suggesting cyst, others nonspecific. Gallbladder decompressed, unremarkable. No biliary ductal dilatation. Pancreas: Unremarkable. No pancreatic ductal dilatation or surrounding inflammatory changes. Spleen: Normal in size without focal abnormality. Adrenals/Urinary Tract: Adrenal glands are unremarkable. Kidneys are normal, without renal calculi, focal lesion, or hydronephrosis. Bladder is unremarkable. Stomach/Bowel: Stomach is within normal limits. Appendix appears normal. No evidence of bowel wall thickening, distention, or inflammatory changes. Vascular/Lymphatic: No significant vascular findings are present. No enlarged abdominal or pelvic lymph nodes. Reproductive: Uterus and bilateral adnexa are unremarkable. Other: No ascites.  No free air. Musculoskeletal: No acute or significant osseous findings. IMPRESSION: 1. No acute findings. 2. Multiple liver lesions, statistically most likely benign cysts in the absence of a history of primary carcinoma. Electronically Signed   By: Corlis Leak M.D.   On: 04/04/2021 12:13    Procedures Procedures   Medications Ordered in ED Medications  HYDROcodone-acetaminophen  (NORCO/VICODIN) 5-325 MG per tablet 2 tablet (2 tablets Oral Given 04/04/21 1101)    ED Course  I have reviewed the triage vital signs and the nursing notes.  Pertinent labs & imaging results that were available during my care of the patient were reviewed by me and considered in my medical decision making (see chart for details).    MDM Rules/Calculators/A&P                          Patient is a 45 year old female who presents with pain in her mid back.  It seems to be more localized to her back however she has associated nausea and a little bit of radiation to her flank.  Given this, CT scan was performed of her abdomen pelvis which shows no acute abnormality.  She has had a prior ultrasound of her gallbladder.  Her labs are nonconcerning.  She has an upcoming MRI scheduled however when I reviewed the order is an MRI of her abdomen and pelvis.  I advised her she may want to contact her treating physicians and obtain an MRI of her spine.  She was given a short course of Vicodin.  She was given the typical precautions with [place] use.  She was advised to follow-up with her PCP.  Return precautions were given. Final Clinical Impression(s) / ED Diagnoses Final diagnoses:  Acute right-sided low back pain without sciatica    Rx / DC Orders ED Discharge Orders         Ordered    HYDROcodone-acetaminophen (NORCO/VICODIN) 5-325 MG tablet  Every 4 hours PRN        04/04/21 1255           Rolan Bucco, MD 04/04/21 1257

## 2021-04-11 ENCOUNTER — Ambulatory Visit
Admission: RE | Admit: 2021-04-11 | Discharge: 2021-04-11 | Disposition: A | Discharge status: Home / Self Care | Payer: BC Managed Care – PPO | Source: Ambulatory Visit | Attending: Physician Assistant | Admitting: Physician Assistant

## 2021-04-11 ENCOUNTER — Other Ambulatory Visit: Payer: Self-pay

## 2021-04-11 DIAGNOSIS — R1011 Right upper quadrant pain: Secondary | ICD-10-CM | POA: Diagnosis not present

## 2021-04-11 DIAGNOSIS — K7689 Other specified diseases of liver: Secondary | ICD-10-CM

## 2021-04-11 DIAGNOSIS — R110 Nausea: Secondary | ICD-10-CM | POA: Diagnosis not present

## 2021-04-11 MED ORDER — GADOBENATE DIMEGLUMINE 529 MG/ML IV SOLN
18.0000 mL | Freq: Once | INTRAVENOUS | Status: AC | PRN
  Administered 2021-04-11: 18 mL via INTRAVENOUS

## 2021-04-16 ENCOUNTER — Other Ambulatory Visit: Payer: Self-pay | Admitting: Nurse Practitioner

## 2021-04-16 DIAGNOSIS — M545 Low back pain, unspecified: Secondary | ICD-10-CM

## 2021-04-17 ENCOUNTER — Other Ambulatory Visit (HOSPITAL_COMMUNITY): Payer: Self-pay | Admitting: Physician Assistant

## 2021-04-17 DIAGNOSIS — R1011 Right upper quadrant pain: Secondary | ICD-10-CM | POA: Diagnosis not present

## 2021-04-17 DIAGNOSIS — K2100 Gastro-esophageal reflux disease with esophagitis, without bleeding: Secondary | ICD-10-CM

## 2021-04-23 DIAGNOSIS — R0781 Pleurodynia: Secondary | ICD-10-CM | POA: Diagnosis not present

## 2021-04-28 ENCOUNTER — Ambulatory Visit (HOSPITAL_COMMUNITY)
Admission: RE | Admit: 2021-04-28 | Discharge: 2021-04-28 | Disposition: A | Discharge status: Home / Self Care | Payer: BC Managed Care – PPO | Source: Ambulatory Visit | Attending: Physician Assistant | Admitting: Physician Assistant

## 2021-04-28 ENCOUNTER — Other Ambulatory Visit: Payer: Self-pay

## 2021-04-28 DIAGNOSIS — R110 Nausea: Secondary | ICD-10-CM | POA: Diagnosis not present

## 2021-04-28 DIAGNOSIS — R1011 Right upper quadrant pain: Secondary | ICD-10-CM | POA: Insufficient documentation

## 2021-04-28 DIAGNOSIS — K2100 Gastro-esophageal reflux disease with esophagitis, without bleeding: Secondary | ICD-10-CM | POA: Diagnosis not present

## 2021-04-28 DIAGNOSIS — R109 Unspecified abdominal pain: Secondary | ICD-10-CM | POA: Diagnosis not present

## 2021-04-28 MED ORDER — TECHNETIUM TC 99M MEBROFENIN IV KIT
5.2500 | PACK | Freq: Once | INTRAVENOUS | Status: AC | PRN
  Administered 2021-04-28: 5.25 via INTRAVENOUS

## 2021-05-09 DIAGNOSIS — R0789 Other chest pain: Secondary | ICD-10-CM | POA: Diagnosis not present

## 2021-06-04 ENCOUNTER — Ambulatory Visit: Payer: BC Managed Care – PPO | Admitting: Neurology

## 2021-06-04 ENCOUNTER — Telehealth: Payer: Self-pay | Admitting: Neurology

## 2021-06-04 NOTE — Progress Notes (Deleted)
[facility]    Provider:  Dr Lucia Gaskins Requesting Provider: Henrine Screws, MD Primary Care Provider:  Henrine Screws, MD  CC:  migraines  Interval history June 04, 2021: I initially saw patient in 2021, subsequently she had multiple Botox injections see notes below March 04, 2021 and December 02, 2020 from her Botox visits.  03/04/2021 Botox visit: Patient is doing excellent, for 2 months she barely had a headache, Botox started wearing off a little bit in the last few weeks but this is normal, and this is her third Botox we should expect maximum efficacy after this and hopefully will last a little longer.  We changed it up a little bit, +7 units in each masseter and procerus because she felt that her headache started there, +5 each orbicularis oculi, we did not put any in the masseters she is not sure if that helped, we did not put 2 extra units over her right eye, she felt this past time the headaches were more in the left sides put some additional Botox in the left temporal parietal areas, we did do the whole protocol, we did not put any extra in the traps or cervical paraspinals and stayed very superficial.  Gave her a refill of Cambia because she loves it.  12/02/2020 Botox visit: this is our first botox. Over the last year or longer daily headaches 16- 20 migraine days a month. She had [facility] do the first botox and she said she had pain in the right occipital area and her shoulders didn;t feel right and she didn;t want to do that again. I encouraged her to try it once more, I am afraid if we do not inject those areas the botox will not be as effective for her migraines as she did get >50% improvement after that first botox injection. I did the cervical muscles very shallow and did not put extra there and did the entire protocol +5U each masseters, +5U each orbic occuli. Also 2U above the right eye as she had a "spock" brow and felt more movement there and this caused  headaches on the right. Ask her to see if it felt more even the way we did it. She loved cambia.   HPI:  Theresa Black is a 45 y.o. female here as requested by Henrine Screws, MD for migraines. Migraines started since her 68s, become worse in recent years. She has been to [facility]. She is here after moving. She has chronic migraines. MIgraines start on the left side, move to the forehead and temples all the way to the apex, dull then becomes pulsating/pounding/throbbing, light and sound sensitivity, nausea, moderately severe to severe, no aura, 24 to 72 hours, movement makes it worse, a dark room helps, no medication overuse, nothing over the counter works. Over the last year or longer daily headaches 16- 20 migraine days a month. Allergies are a trigger, worse during allergies. Tried and failed multiple medications. Not positional, not exertional, no vision changes. No other focal neurologic deficits, associated symptoms, inciting events or modifiable factors.  Medications tried include: propranolol, cymbalta, Aimovig severe reaction (CGRPs tried and failed contraindicated), fluoxetine, topiramate, amitriptyline, nortriptyline, magnesum, b2(migrelief), zipsor, rizatriptan, sumatriptan, ibuprofen, tylenol, caffeine, alleve(allergy), benadryl, diclofenac tabs, zipsor  Reviewed notes, labs and imaging from outside physicians, which showed:  Reviewed notes from [facility] neurolog[facility], she has a history of migraines which began in high school, at that time her headaches were more frequent in the spring due to allergies and hayfever but  the rest of the year they were to occur about once a month around her menstrual periods, her headaches lasted for around 4 hours and were relieved with ibuprofen and had massage, in college she would only have a few headaches each spring with associated nausea, photophobia, phonophobia, she felt that Effexor helped for depression, but since then she had waxing and waning  in the severity of her headaches, multiple headaches per week with associated nausea photophobia and phonophobia, resolved with ibuprofen and Excedrin, naratriptan was not effective but Zipsor helped, she thinks she had allergic reaction to my relief, she tried propranolol and Topamax as well, propranolol increased did not help, she tried Cymbalta as well, propranolol and Cymbalta caused some side effects with cognition.  Reviewed CT Sinus report as below:   INDICATION: J32.0 Chronic maxillary sinusitis, J01.91 Acute recurrent  sinusitis, unspecified, chronic maxillary sinus pressure, recurrent acute  sinusitis provided.    .   COMPARISON: None   TECHNIQUE/PROTOCOL: Standard noncontrast images through the paranasal  sinuses including direct axial images and reformatted coronal and sagittal  images.   FINDINGS:      The right maxillary sinus is normal. The right frontal sinus is normal. The right ethmoid air cells are normal . The right sphenoid sinus is normal. The left maxillary sinus has a moderate amount of mucosal thickening along its caudad aspect, with stenosis of the osteomeatal complex . The left frontal sinus is normal. The left ethmoid air cells are normal. The left sphenoid is normal.   The right ostiomeatal complex is normal.  The frontal recess and  sphenoethmoidal recesses are patent.   No air-fluid levels are identified.  No bony thickening, sinus expansion or  erosion. The nasal septum is mildly deviated to the right.  The mastoid air  cells are normal. The visualized portions of the brain and orbits are  unremarkable. 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   CMP and TSH normal 03/2020  Review of Systems: Patient complains of symptoms per HPI as well as the following symptoms: migraines. Pertinent negatives and positives per HPI. All  others negative.   Social History   Socioeconomic History   Marital status: Married    Spouse name: Not on file   Number of children: 1   Years of education: Not on file   Highest education level: Bachelor's degree (e.g., BA, AB, BS)  Occupational History    Comment: work from home  Tobacco Use   Smoking status: Former    Types: Cigarettes    Quit date: 2004    Years since quitting: 18.5   Smokeless tobacco: Never  Vaping Use   Vaping Use: Never used  Substance and Sexual Activity   Alcohol use: Yes    Comment: on occasion   Drug use: Never   Sexual activity: Not on file  Other Topics Concern   Not on file  Social History Narrative   Lives with husband, child   Caffeine- coffee 3-5 c daily   Social Determinants of Health   Financial Resource Strain: Not on file  Food Insecurity: Not on file  Transportation Needs: Not on file  Physical Activity: Not on file  Stress: Not on file  Social Connections: Not on file  Intimate Partner Violence: Not on file    Family History  Problem Relation Age of Onset   Hashimoto's thyroiditis  Mother     Past Medical History:  Diagnosis Date   Eczema    Migraines    Shingles 12/2019   Urticaria     Patient Active Problem List   Diagnosis Date Noted   Chronic migraine without aura without status migrainosus, not intractable 08/20/2020   Encounter for screening for diseases of the blood and blood-forming organs and certain disorders involving the immune mechanism 06/24/2020   History of shingles 06/24/2020   History of wheezing 06/24/2020   History of urticaria 06/24/2020   Chronic rhinitis 06/24/2020   Adverse food reaction 06/24/2020   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      Diclofenac Potassium (ZIPSOR) 25 MG CAPS Take by mouth.     Diclofenac Potassium,Migraine, 50 MG PACK Take 50 mg by mouth 2 (two) times daily as needed. For migraine. 9 each 11   FLUoxetine (PROZAC) 40 MG capsule Take 40 mg by mouth daily.     HYDROcodone-acetaminophen (NORCO/VICODIN) 5-325 MG tablet Take 1-2 tablets by mouth every 4 (four) hours as needed. 12 tablet 0   ibuprofen (ADVIL) 800 MG tablet      loratadine (CLARITIN) 10 MG tablet Take 10 mg by mouth daily.     TIROSINT 88 MCG CAPS Take 1 capsule by mouth daily.     No current facility-administered medications for this visit.    Allergies as of 06/04/2021 - Review Complete 04/04/2021  Allergen Reaction Noted   Amoxicillin Hives 05/03/2013   Sulfamethoxazole-trimethoprim Nausea And Vomiting and Hives 05/03/2013   Aimovig [erenumab-aooe] Other (See Comments) 08/20/2020   Levonorgestrel Itching and Other (See Comments) 09/24/2014   Orphenadrine citrate Other (See Comments) 05/03/2013   Other  12/03/2020   Valtrex [valacyclovir] Other (See Comments) 08/20/2020    Vitals: There were no vitals taken for this visit. Last Weight:  Wt Readings from Last 1 Encounters:  04/04/21 180 lb (81.6 kg)   Last Height:   Ht Readings from Last 1 Encounters:  04/04/21 5\' 8"  (1.727 m)     Physical exam: Exam: Gen: NAD, conversant, well nourised, overweight , well groomed                     CV: RRR, no MRG. No Carotid Bruits. No peripheral edema, warm, nontender Eyes: Conjunctivae clear without exudates or hemorrhage  Neuro: Detailed Neurologic Exam  Speech:    Speech is normal; fluent and spontaneous with normal comprehension.  Cognition:    The patient is oriented to person, place, and time;     recent and remote memory intact;     language fluent;     normal attention, concentration,     fund of knowledge Cranial Nerves:    The pupils are equal, round, and reactive to light. The fundi are normal and spontaneous venous  pulsations are present. Visual fields are full to finger confrontation. Extraocular movements are intact. Trigeminal sensation is intact and the muscles of mastication are normal. The face is symmetric. The palate elevates in the midline. Hearing intact. Voice is normal. Shoulder shrug is normal. The tongue has normal motion without fasciculations.   Coordination:    No dysmetria or ataxia  Gait: Normal native gait.   Motor Observation:  No asymmetry, no atrophy, and no involuntary movements noted. Tone:    Normal muscle tone.    Posture:    Posture is normal. normal erect    Strength:    Strength is V/V in the upper and lower limbs.      Sensation: intact to LT     Reflex Exam:  DTR's:    Deep tendon reflexes in the upper and lower extremities are normal bilaterally.   Toes:    The toes are downgoing bilaterally.   Clonus:    Clonus is absent.    Assessment/Plan:  45 year old with chronic migraines failed multiple classes of medications, discussed options.   Botox: prevention Acute; Rizatriptan, cambia, (she is fine with zipsor which is diclofenac so should be fine with cambia)  Consider MRI of the brain if no improvement with treatment.   Discused: To prevent or relieve headaches, try the following: Cool Compress. Lie down and place a cool compress on your head.  Avoid headache triggers. If certain foods or odors seem to have triggered your migraines in the past, avoid them. A headache diary might help you identify triggers.  Include physical activity in your daily routine. Try a daily walk or other moderate aerobic exercise.  Manage stress. Find healthy ways to cope with the stressors, such as delegating tasks on your to-do list.  Practice relaxation techniques. Try deep breathing, yoga, massage and visualization.  Eat regularly. Eating regularly scheduled meals and maintaining a healthy diet might help prevent headaches. Also, drink plenty of fluids.  Follow a  regular sleep schedule. Sleep deprivation might contribute to headaches Consider biofeedback. With this mind-body technique, you learn to control certain bodily functions -- such as muscle tension, heart rate and blood pressure -- to prevent headaches or reduce headache pain.    Proceed to emergency room if you experience new or worsening symptoms or symptoms do not resolve, if you have new neurologic symptoms or if headache is severe, or for any concerning symptom.   Provided education and documentation from American headache Society toolbox including articles on: chronic migraine medication overuse headache, chronic migraines, prevention of migraines, behavioral and other nonpharmacologic treatments for headache.   No orders of the defined types were placed in this encounter.  No orders of the defined types were placed in this encounter.   Cc: Henrine Screws, MD,  Henrine Screws, MD  Naomie Dean, MD  [facility] [address] [place], [place] [address]  Phone [phone] Fax [phone]

## 2021-06-04 NOTE — Telephone Encounter (Signed)
Patient has no-showed twice and canceled multiple appointments. Currently she has an appointment with me in October. Please call and explain we have so many patients waiting to be seen and inform her if she no-shows again she may be dismissed from our practice; also ensure she really wants to be seen in October, otherwise cancel appointment.Happy to see her but we have so many patients needing care need to make sure, thanks

## 2021-06-05 NOTE — Telephone Encounter (Signed)
Called pt, she was very apologetic for missing Botox appointment and stated she completely forgot/didn't have appointment written down. She would definitely like to keep her appointment in October and would like to reschedule her Botox if possible. Informed pt of no-show policy.

## 2021-06-16 NOTE — Telephone Encounter (Signed)
I called patient, she is scheduled for Botox 9/13.

## 2021-06-17 DIAGNOSIS — R1011 Right upper quadrant pain: Secondary | ICD-10-CM | POA: Diagnosis not present

## 2021-06-17 DIAGNOSIS — E039 Hypothyroidism, unspecified: Secondary | ICD-10-CM | POA: Diagnosis not present

## 2021-06-25 DIAGNOSIS — E039 Hypothyroidism, unspecified: Secondary | ICD-10-CM | POA: Diagnosis not present

## 2021-06-25 DIAGNOSIS — R200 Anesthesia of skin: Secondary | ICD-10-CM | POA: Diagnosis not present

## 2021-06-25 DIAGNOSIS — R1011 Right upper quadrant pain: Secondary | ICD-10-CM | POA: Diagnosis not present

## 2021-06-25 DIAGNOSIS — K7689 Other specified diseases of liver: Secondary | ICD-10-CM | POA: Diagnosis not present

## 2021-06-25 DIAGNOSIS — R202 Paresthesia of skin: Secondary | ICD-10-CM | POA: Diagnosis not present

## 2021-06-25 DIAGNOSIS — R10813 Right lower quadrant abdominal tenderness: Secondary | ICD-10-CM | POA: Diagnosis not present

## 2021-07-02 DIAGNOSIS — R1011 Right upper quadrant pain: Secondary | ICD-10-CM | POA: Diagnosis not present

## 2021-07-07 NOTE — Telephone Encounter (Signed)
Patient's auth with [employer] [place] expired 7/23. I called [facility] to initiate new PA. I spoke with Amber who was able to approve the request. PA #[id number] (07/07/21- 01/02/22).

## 2021-07-10 DIAGNOSIS — M47812 Spondylosis without myelopathy or radiculopathy, cervical region: Secondary | ICD-10-CM | POA: Diagnosis not present

## 2021-07-10 DIAGNOSIS — M47814 Spondylosis without myelopathy or radiculopathy, thoracic region: Secondary | ICD-10-CM | POA: Diagnosis not present

## 2021-07-16 DIAGNOSIS — Z1231 Encounter for screening mammogram for malignant neoplasm of breast: Secondary | ICD-10-CM | POA: Diagnosis not present

## 2021-07-18 DIAGNOSIS — R1011 Right upper quadrant pain: Secondary | ICD-10-CM | POA: Diagnosis not present

## 2021-07-22 ENCOUNTER — Ambulatory Visit: Payer: BC Managed Care – PPO | Admitting: Neurology

## 2021-07-22 DIAGNOSIS — G43709 Chronic migraine without aura, not intractable, without status migrainosus: Secondary | ICD-10-CM | POA: Diagnosis not present

## 2021-07-22 NOTE — Progress Notes (Signed)
Botox- 200 units x 1 vial Lot: [id number] Expiration: 12/2023 NDC: 0023-3921-02  Bacteriostatic 0.9% Sodium Chloride- 4mL total Lot: [id number] Expiration: 0101/2024 NDC: 0409-1966-02  Dx G43.709 B/B  

## 2021-07-22 NOTE — Progress Notes (Signed)
Consent Form Botulism Toxin Injection For Chronic Migraine     07/22/2021:+a. No migraines in the last 3 month, spectacular. Do the frontalis a little high in the forehead to avoid brow elevation. Spread botox in the left temporalis area Follow the pain protocol in addition to botox protocol.     Reviewed orally with patient, additionally signature is on file:  Botulism toxin has been approved by the Federal drug administration for treatment of chronic migraine. Botulism toxin does not cure chronic migraine and it may not be effective in some patients.  The administration of botulism toxin is accomplished by injecting a small amount of toxin into the muscles of the neck and head. Dosage must be titrated for each individual. Any benefits resulting from botulism toxin tend to wear off after 3 months with a repeat injection required if benefit is to be maintained. Injections are usually done every 3-4 months with maximum effect peak achieved by about 2 or 3 weeks. Botulism toxin is expensive and you should be sure of what costs you will incur resulting from the injection.  The side effects of botulism toxin use for chronic migraine may include:   -Transient, and usually mild, facial weakness with facial injections  -Transient, and usually mild, head or neck weakness with head/neck injections  -Reduction or loss of forehead facial animation due to forehead muscle weakness  -Eyelid drooping  -Dry eye  -Pain at the site of injection or bruising at the site of injection  -Double vision  -Potential unknown long term risks  Contraindications: You should not have Botox if you are pregnant, nursing, allergic to albumin, have an infection, skin condition, or muscle weakness at the site of the injection, or have myasthenia gravis, Lambert-Eaton syndrome, or ALS.  It is also possible that as with any injection, there may be an allergic reaction or no effect from the medication. Reduced effectiveness  after repeated injections is sometimes seen and rarely infection at the injection site may occur. All care will be taken to prevent these side effects. If therapy is given over a long time, atrophy and wasting in the muscle injected may occur. Occasionally the patient's become refractory to treatment because they develop antibodies to the toxin. In this event, therapy needs to be modified.  I have read the above information and consent to the administration of botulism toxin.    BOTOX PROCEDURE NOTE FOR MIGRAINE HEADACHE    Contraindications and precautions discussed with patient(above). Aseptic procedure was observed and patient tolerated procedure. Procedure performed by Dr. Artemio Aly  The condition has existed for more than 6 months, and pt does not have a diagnosis of ALS, Myasthenia Gravis or Lambert-Eaton Syndrome.  Risks and benefits of injections discussed and pt agrees to proceed with the procedure.  Written consent obtained  These injections are medically necessary. Pt  receives good benefits from these injections. These injections do not cause sedations or hallucinations which the oral therapies may cause.  Description of procedure:  The patient was placed in a sitting position. The standard protocol was used for Botox as follows, with 5 units of Botox injected at each site:   -Procerus muscle, midline injection  -Corrugator muscle, bilateral injection  -Frontalis muscle, bilateral injection, with 2 sites each side, medial injection was performed in the upper one third of the frontalis muscle, in the region vertical from the medial inferior edge of the superior orbital rim. The lateral injection was again in the upper one third of the forehead  vertically above the lateral limbus of the cornea, 1.5 cm lateral to the medial injection site.  -Temporalis muscle injection, 4 sites, bilaterally. The first injection was 3 cm above the tragus of the ear, second injection site was 1.5  cm to 3 cm up from the first injection site in line with the tragus of the ear. The third injection site was 1.5-3 cm forward between the first 2 injection sites. The fourth injection site was 1.5 cm posterior to the second injection site.   -Occipitalis muscle injection, 3 sites, bilaterally. The first injection was done one half way between the occipital protuberance and the tip of the mastoid process behind the ear. The second injection site was done lateral and superior to the first, 1 fingerbreadth from the first injection. The third injection site was 1 fingerbreadth superiorly and medially from the first injection site.  -Cervical paraspinal muscle injection, 2 sites, bilateral knee first injection site was 1 cm from the midline of the cervical spine, 3 cm inferior to the lower border of the occipital protuberance. The second injection site was 1.5 cm superiorly and laterally to the first injection site.  -Trapezius muscle injection was performed at 3 sites, bilaterally. The first injection site was in the upper trapezius muscle halfway between the inflection point of the neck, and the acromion. The second injection site was one half way between the acromion and the first injection site. The third injection was done between the first injection site and the inflection point of the neck.   Will return for repeat injection in 3 months.   155 units of Botox was used, 45 Botox not injected was wasted. The patient tolerated the procedure well, there were no complications of the above procedure.

## 2021-08-04 DIAGNOSIS — R1011 Right upper quadrant pain: Secondary | ICD-10-CM | POA: Diagnosis not present

## 2021-08-06 DIAGNOSIS — G894 Chronic pain syndrome: Secondary | ICD-10-CM | POA: Diagnosis not present

## 2021-08-06 DIAGNOSIS — R1011 Right upper quadrant pain: Secondary | ICD-10-CM | POA: Diagnosis not present

## 2021-08-07 ENCOUNTER — Telehealth: Payer: Self-pay | Admitting: *Deleted

## 2021-08-07 NOTE — Telephone Encounter (Signed)
Harlene Syme KeyRudy Jew - PA Case ID: [id number]  Approved today CaseId:[id number];Status:Approved;Review Type:Prior Auth;Coverage Start Date:07/08/2021;Coverage End Date:08/07/2022;  Will Contact Patient in MYchart to make her aware of Approval

## 2021-08-14 ENCOUNTER — Institutional Professional Consult (permissible substitution): Payer: BC Managed Care – PPO | Admitting: Neurology

## 2021-08-18 DIAGNOSIS — R1011 Right upper quadrant pain: Secondary | ICD-10-CM | POA: Diagnosis not present

## 2021-08-18 DIAGNOSIS — G8929 Other chronic pain: Secondary | ICD-10-CM | POA: Diagnosis not present

## 2021-09-01 DIAGNOSIS — G894 Chronic pain syndrome: Secondary | ICD-10-CM | POA: Diagnosis not present

## 2021-09-01 DIAGNOSIS — R1011 Right upper quadrant pain: Secondary | ICD-10-CM | POA: Diagnosis not present

## 2021-09-01 DIAGNOSIS — M47814 Spondylosis without myelopathy or radiculopathy, thoracic region: Secondary | ICD-10-CM | POA: Diagnosis not present

## 2021-09-01 DIAGNOSIS — M792 Neuralgia and neuritis, unspecified: Secondary | ICD-10-CM | POA: Diagnosis not present

## 2021-09-03 DIAGNOSIS — E041 Nontoxic single thyroid nodule: Secondary | ICD-10-CM | POA: Diagnosis not present

## 2021-09-03 DIAGNOSIS — Z8349 Family history of other endocrine, nutritional and metabolic diseases: Secondary | ICD-10-CM | POA: Diagnosis not present

## 2021-09-03 DIAGNOSIS — J029 Acute pharyngitis, unspecified: Secondary | ICD-10-CM | POA: Diagnosis not present

## 2021-09-03 DIAGNOSIS — R0982 Postnasal drip: Secondary | ICD-10-CM | POA: Diagnosis not present

## 2021-09-03 DIAGNOSIS — E039 Hypothyroidism, unspecified: Secondary | ICD-10-CM | POA: Diagnosis not present

## 2021-09-03 DIAGNOSIS — D500 Iron deficiency anemia secondary to blood loss (chronic): Secondary | ICD-10-CM | POA: Diagnosis not present

## 2021-09-09 ENCOUNTER — Ambulatory Visit: Payer: BC Managed Care – PPO | Admitting: Neurology

## 2021-09-17 DIAGNOSIS — G8929 Other chronic pain: Secondary | ICD-10-CM | POA: Diagnosis not present

## 2021-09-17 DIAGNOSIS — E039 Hypothyroidism, unspecified: Secondary | ICD-10-CM | POA: Diagnosis not present

## 2021-09-17 DIAGNOSIS — M940 Chondrocostal junction syndrome [Tietze]: Secondary | ICD-10-CM | POA: Diagnosis not present

## 2021-09-17 DIAGNOSIS — R1011 Right upper quadrant pain: Secondary | ICD-10-CM | POA: Diagnosis not present

## 2021-09-26 DIAGNOSIS — E041 Nontoxic single thyroid nodule: Secondary | ICD-10-CM | POA: Diagnosis not present

## 2021-09-26 DIAGNOSIS — Z8349 Family history of other endocrine, nutritional and metabolic diseases: Secondary | ICD-10-CM | POA: Diagnosis not present

## 2021-09-26 DIAGNOSIS — E039 Hypothyroidism, unspecified: Secondary | ICD-10-CM | POA: Diagnosis not present

## 2021-10-09 ENCOUNTER — Telehealth: Payer: Self-pay | Admitting: Neurology

## 2021-10-09 NOTE — Telephone Encounter (Signed)
Per Misty Stanley in phone room, patient called today to cancel her 12/15 Botox injection with Aundra Millet because she will be transferring care. Misty Stanley states she was transferred to medical records to have her records sent to her new provider.

## 2021-10-11 ENCOUNTER — Encounter: Payer: Self-pay | Admitting: Neurology

## 2021-10-23 ENCOUNTER — Ambulatory Visit: Payer: BC Managed Care – PPO | Admitting: Adult Health

## 2021-12-15 ENCOUNTER — Encounter (HOSPITAL_BASED_OUTPATIENT_CLINIC_OR_DEPARTMENT_OTHER): Payer: Self-pay

## 2021-12-15 ENCOUNTER — Other Ambulatory Visit: Payer: Self-pay

## 2021-12-15 ENCOUNTER — Emergency Department (HOSPITAL_BASED_OUTPATIENT_CLINIC_OR_DEPARTMENT_OTHER)
Admission: EM | Admit: 2021-12-15 | Discharge: 2021-12-15 | Disposition: A | Discharge status: Home / Self Care | Payer: BC Managed Care – PPO | Attending: Emergency Medicine | Admitting: Emergency Medicine

## 2021-12-15 DIAGNOSIS — R1011 Right upper quadrant pain: Secondary | ICD-10-CM | POA: Diagnosis not present

## 2021-12-15 DIAGNOSIS — R079 Chest pain, unspecified: Secondary | ICD-10-CM | POA: Insufficient documentation

## 2021-12-15 DIAGNOSIS — R0781 Pleurodynia: Secondary | ICD-10-CM | POA: Diagnosis present

## 2021-12-15 LAB — COMPREHENSIVE METABOLIC PANEL
ALT: 15 U/L (ref 0–44)
AST: 18 U/L (ref 15–41)
Albumin: 4.2 g/dL (ref 3.5–5.0)
Alkaline Phosphatase: 50 U/L (ref 38–126)
Anion gap: 7 (ref 5–15)
BUN: 11 mg/dL (ref 6–20)
CO2: 26 mmol/L (ref 22–32)
Calcium: 9 mg/dL (ref 8.9–10.3)
Chloride: 101 mmol/L (ref 98–111)
Creatinine, Ser: 0.85 mg/dL (ref 0.44–1.00)
GFR, Estimated: >60 mL/min (ref >60)
Glucose, Bld: 139 mg/dL — ABNORMAL HIGH (ref 70–99)
Potassium: 3.5 mmol/L (ref 3.5–5.1)
Sodium: 134 mmol/L — ABNORMAL LOW (ref 135–145)
Total Bilirubin: 0.5 mg/dL (ref 0.3–1.2)
Total Protein: 7.3 g/dL (ref 6.5–8.1)

## 2021-12-15 LAB — URINALYSIS, ROUTINE W REFLEX MICROSCOPIC
Bilirubin Urine: NEGATIVE
Glucose, UA: NEGATIVE mg/dL
Ketones, ur: NEGATIVE mg/dL
Leukocytes,Ua: NEGATIVE
Nitrite: NEGATIVE
Protein, ur: NEGATIVE mg/dL
Specific Gravity, Urine: 1.01 (ref 1.005–1.030)
pH: 6 (ref 5.0–8.0)

## 2021-12-15 LAB — PREGNANCY, URINE: Preg Test, Ur: NEGATIVE

## 2021-12-15 LAB — CBC
HCT: 40.3 % (ref 36.0–46.0)
Hemoglobin: 13.7 g/dL (ref 12.0–15.0)
MCH: 32.1 pg (ref 26.0–34.0)
MCHC: 34 g/dL (ref 30.0–36.0)
MCV: 94.4 fL (ref 80.0–100.0)
Platelets: 241 10*3/uL (ref 150–400)
RBC: 4.27 MIL/uL (ref 3.87–5.11)
RDW: 12.5 % (ref 11.5–15.5)
WBC: 9.5 10*3/uL (ref 4.0–10.5)
nRBC: 0 % (ref 0.0–0.2)

## 2021-12-15 LAB — URINALYSIS, MICROSCOPIC (REFLEX): WBC, UA: NONE SEEN WBC/hpf (ref 0–5)

## 2021-12-15 LAB — LIPASE, BLOOD: Lipase: 44 U/L (ref 11–51)

## 2021-12-15 NOTE — ED Triage Notes (Signed)
Pt reports she has had abdominal pain x 1 year but today she woke up with worsening pain and noticed a "buldge" to right upper quadrant. The buldge does feel hard. Pt also reports nausea.

## 2021-12-15 NOTE — ED Notes (Signed)
Pt. Reports she has had pain in the R upper quadrant also reports a knot in the upper R quadrant.  Pt. In no distress and has clear lung snds.

## 2021-12-15 NOTE — ED Notes (Signed)
Pt not in room at this time

## 2021-12-16 NOTE — ED Provider Notes (Signed)
[facility] EMERGENCY DEPARTMENT Provider Note   CSN: [id number] Arrival date & time: 12/15/21  2118     History  Chief Complaint  Patient presents with   Abdominal Pain    Theresa Black is a 46 y.o. female.  46 year old female who presents to the emergency department today secondary to right upper abdominal pain/lower chest pain.  Patient states that she has had on and off again pain over the last year or so.  She has had ultrasounds, MRIs and CT scans done to evaluate for the same.  She is on Lyrica currently which was managing it.  It sounds like the working diagnosis is for shingles without a rash.  Reviewing the notes and epic it appears that the patient had relief of valacyclovir sometime in the past prior to getting a nerve ablation and thus that procedure was canceled.   Sounds like recently the pain has been getting worse and then today she woke up and she felt a abnormal lump under her right breast but not in her breast.  She states that it is tender in that area and pushing it seems to recreate the pain.  She had her husband feel the same area and he noted as well and thus she presents here for further evaluation.  No fevers, nausea, vomiting, diarrhea, constipation or other associated symptoms.  She is not short of breath.  The pain does not seem to radiate anywhere besides around that dermatome.  No new trauma. No new rashes. No urinary symptoms.    Abdominal Pain     Home Medications Prior to Admission medications   Medication Sig Start Date End Date Taking? Authorizing Provider  Acetaminophen (TYLENOL EXTRA STRENGTH PO) Take by mouth.    [provider]  Diclofenac Potassium (ZIPSOR) 25 MG CAPS Take by mouth.    [provider]  Diclofenac Potassium,Migraine, 50 MG PACK Take 50 mg by mouth 2 (two) times daily as needed. For migraine. 03/04/21   Anson Fret, MD  FLUoxetine (PROZAC) 40 MG capsule Take 40 mg by mouth daily.    [provider]  gabapentin (NEURONTIN) 600 MG tablet Take 600 mg by mouth 3 (three) times daily. 07/15/21   [provider]  HYDROcodone-acetaminophen (NORCO/VICODIN) 5-325 MG tablet Take 1-2 tablets by mouth every 4 (four) hours as needed. 04/04/21   Rolan Bucco, MD  ibuprofen (ADVIL) 800 MG tablet  01/18/14   [provider]  loratadine (CLARITIN) 10 MG tablet Take 10 mg by mouth daily.    [provider]  methocarbamol (ROBAXIN) 750 MG tablet Take 750 mg by mouth 3 (three) times daily. 07/02/21   [provider]  TIROSINT 88 MCG CAPS Take 1 capsule by mouth daily. 08/17/20   [provider]  valACYclovir (VALTREX) 1000 MG tablet Take 1,000 mg by mouth 3 (three) times daily. x7 days 07/18/21   [provider]      Allergies    Amoxicillin, Sulfamethoxazole-trimethoprim, Aimovig [erenumab-aooe], Levonorgestrel, Orphenadrine citrate, Other, and Valtrex [valacyclovir]    Review of Systems   Review of Systems  Gastrointestinal:  Positive for abdominal pain. Negative for blood in stool.   Physical Exam Updated Vital Signs BP 136/90 (BP Location: Left Arm)    Pulse 77    Temp 98.1 F (36.7 C) (Oral)    Resp 18    Ht 5\' 8"  (1.727 m)    Wt 81.6 kg    SpO2 100%    BMI 27.37 kg/m  Physical Exam Vitals and nursing note reviewed.  Constitutional:      Appearance: She is well-developed.  HENT:     Head: Normocephalic and atraumatic.     Mouth/Throat:     Mouth: Mucous membranes are moist.     Pharynx: Oropharynx is clear.  Eyes:     Pupils: Pupils are equal, round, and reactive to light.  Cardiovascular:     Rate and Rhythm: Normal rate and regular rhythm.  Pulmonary:     Effort: No respiratory distress.     Breath sounds: No stridor.  Abdominal:     General: Abdomen is flat. There is no distension.     Tenderness: There is no abdominal tenderness (pain seems to be more located over right lower rib).  Musculoskeletal:        General:  Tenderness (over right lower rib. no palpable masses or masses appreciated on visual exam. no erythema. no fluctuance.) present.     Cervical back: Normal range of motion.     Comments: Chaperoned by nurse: [name]  Skin:    General: Skin is warm and dry.     Findings: No erythema or rash.  Neurological:     General: No focal deficit present.     Mental Status: She is alert.    ED Results / Procedures / Treatments   Labs (all labs ordered are listed, but only abnormal results are displayed) Labs Reviewed  COMPREHENSIVE METABOLIC PANEL - Abnormal; Notable for the following components:      Result Value   Sodium 134 (*)    Glucose, Bld 139 (*)    All other components within normal limits  URINALYSIS, ROUTINE W REFLEX MICROSCOPIC - Abnormal; Notable for the following components:   Hgb urine dipstick SMALL (*)    All other components within normal limits  URINALYSIS, MICROSCOPIC (REFLEX) - Abnormal; Notable for the following components:   Bacteria, UA RARE (*)    All other components within normal limits  LIPASE, BLOOD  CBC  PREGNANCY, URINE    EKG None  Radiology No results found.  Procedures Procedures    Medications Ordered in ED Medications - No data to display  ED Course/ Medical Decision Making/ A&P                           Medical Decision Making Amount and/or Complexity of Data Reviewed Labs: ordered.   46 year old female here with an acute exacerbation of right lower chest pain.  Tender to palpation in same area but no obvious masses.  I doubt that she has a new rib fracture without trauma.  Her labs are reassuring, liver MRI in the past and ultrasounds and no other GI symptoms to suggest a hepatobiliary pathology.  No respiratory complaints to suggest pneumonia or blood clot.  Had a long discussion with the patient about expectations from the emergency room.  She has had multiple tests done with similar complaint over the last year and has seen multiple  primary care doctors and specialists regarding the same.  I discussed that it seems to be either subcutaneous or something to do with that rib or muscle in that area.  I offered to do a CT scan to ensure nothing is changed since her CT scan about a year ago in May.  I also offered to give a dose of pain medicine while she was in the emergency room to see if that would help.  She was reluctant  to do either of these.  She stated that she just wanted a diagnosis and to get it fixed.  I once again explained that the emergency department does not really have all the diagnostic tools in order to diagnose these types of issues have been going on for quite a while with multiple tests already.  I did not feel that she had any life or limb threatening illness at this time but would do the CT scan to more definitively rule those out.  She did not seem happy with this answer, stating that she would just get a CT scan through her primary doctor if she needed one and promptly left without her discharge papers.  Final Clinical Impression(s) / ED Diagnoses Final diagnoses:  Rib pain on right side    Rx / DC Orders ED Discharge Orders     None         Korrie Hofbauer, Barbara Cower, MD 12/16/21 [phone]

## 2022-01-09 IMAGING — NM NM HEPATO W/GB/PHARM/[PERSON_NAME]
2 series · 12 of 12 positions shown · non-contrast
Comparison: MRI abdomen April 11, 2021

CLINICAL DATA: Three months of abdominal pain with nausea

EXAM:
NUCLEAR MEDICINE HEPATOBILIARY IMAGING WITH GALLBLADDER EF
TECHNIQUE: Sequential images of the abdomen were obtained [DATE] minutes
following intravenous administration of radiopharmaceutical. After
oral ingestion of Ensure, gallbladder ejection fraction was
determined. At 60 min, normal ejection fraction is greater than 33%.
RADIOPHARMACEUTICALS:  5.25 mCi Jc-22m  Choletec IV

[Series 1: biliary · 4.14mm/px · 6 of 60 frames shown]
[frame 6/60]
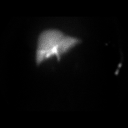
[frame 16/60]
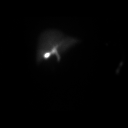
[frame 26/60]
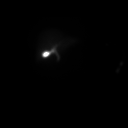
[frame 36/60]
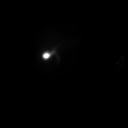
[frame 46/60]
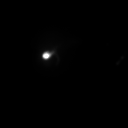
[frame 56/60]
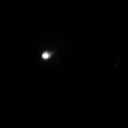

[Series 2: gbef · 4.14mm/px · 6 of 60 frames shown]
[frame 6/60]
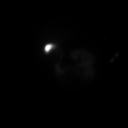
[frame 16/60]
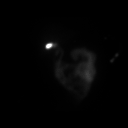
[frame 26/60]
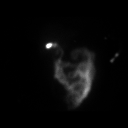
[frame 36/60]
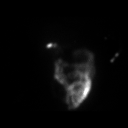
[frame 46/60]
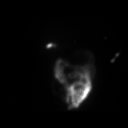
[frame 56/60]
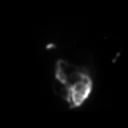

[12 of 12 positions shown; findings below may reference images not displayed]

FINDINGS: There is prompt, uniform radiotracer uptake by the liver with normal
filling of the intrahepatic ducts, common bile duct. Gallbladder
activity is visualized, consistent with patency of cystic duct
(normal < 60 minutes). Additionally there is normal biliary to bowel
transit (normal < 60 minutes), consistent with patent common bile
duct.

Ensure was administered and the gallbladder appears to empty
normally on sequential images. Calculated gallbladder ejection
fraction is 94%. (Normal gallbladder ejection fraction with Ensure
is greater than 33%.)

No evidence of enterogastric biliary reflux.
IMPRESSION: 1.  Patent cystic and common bile ducts.

2.  Normal gallbladder ejection fraction.

## 2022-06-15 ENCOUNTER — Institutional Professional Consult (permissible substitution): Payer: BC Managed Care – PPO | Admitting: Pulmonary Disease

## 2024-02-25 ENCOUNTER — Other Ambulatory Visit: Payer: Self-pay | Admitting: Nurse Practitioner

## 2024-02-25 DIAGNOSIS — N200 Calculus of kidney: Secondary | ICD-10-CM

## 2024-02-25 DIAGNOSIS — K7689 Other specified diseases of liver: Secondary | ICD-10-CM

## 2024-02-25 DIAGNOSIS — R1011 Right upper quadrant pain: Secondary | ICD-10-CM

## 2024-03-10 ENCOUNTER — Other Ambulatory Visit

## 2024-03-13 ENCOUNTER — Ambulatory Visit
Admission: RE | Admit: 2024-03-13 | Discharge: 2024-03-13 | Discharge status: Home / Self Care | Source: Ambulatory Visit | Attending: Nurse Practitioner

## 2024-03-13 DIAGNOSIS — N200 Calculus of kidney: Secondary | ICD-10-CM

## 2024-03-13 DIAGNOSIS — K7689 Other specified diseases of liver: Secondary | ICD-10-CM

## 2024-03-13 DIAGNOSIS — R1011 Right upper quadrant pain: Secondary | ICD-10-CM

## 2024-03-13 MED ORDER — IOPAMIDOL (ISOVUE-300) INJECTION 61%
100.0000 mL | Freq: Once | INTRAVENOUS | Status: AC | PRN
  Administered 2024-03-13: 100 mL via INTRAVENOUS

## 2024-03-15 ENCOUNTER — Other Ambulatory Visit

## 2024-10-19 NOTE — Progress Notes (Signed)
 Subjective   Patient ID:  Theresa Black is a 48 y.o. (DOB Mar 22, 1976) female.  No chief complaint on file.   HPI: Theresa Black is here today to follow up on ovarian cysts. She had a pelvic ultrasound on 10/19/24. She had recent gallbladder surgery and her imaging prior to her surgery revealed an ovarian cyst and yesterday's imaging was done as follow up. Theresa Black also is having some dermatitis related to her menstrual cycles and is wondering if this could be hormonal and due to perimenopause.   Past Medical History, Past Surgery History, Allergies, Social History, and Family History were reviewed and updated.    Review of Systems is complete and negative except as noted.  Objective   LMP  (LMP Unknown)    General:  Well Developed, Well Nourished, No distress 10/19/24: pelvic ultrasound:  EXAM: US  PELVIS TRANSABDOMINAL TRANSVAGINAL WITHOUT DOPPLER DATE: 10/19/2024 7:59 AM ACCESSION: US [id number] DICTATED: 10/19/2024 10:41 AM   CLINICAL INDICATION: 48 years old Female with Adnexal cyst     TECHNIQUE: Ultrasound views of the pelvis were obtained transabdominally  and endovaginally  using gray scale and color Doppler imaging.   COMPARISON: CT 09/15/2024   FINDINGS:    UTERUS/CERVIX: The uterus is anteverted measuring 9.0 x 3.8 x 6.1 cm. Myometrial fibroid measuring 1.5 cm.  The endometrium was normal in thickness  measuring 7 mm   OVARIES: The ovaries were seen well transvaginally. Right adnexal simple cyst measures 2.7 x 1.0 x 1.7 cm. Left adnexal cyst measures 1.2 x 1.3 x 1.6 cm.  Appropriate arterial and venous flow was seen in both ovaries with color doppler.   OTHER: No abnormal pelvic free fluid.      IMPRESSION:  1. Simple bilateral adnexal cysts. No follow-up required. 2. Small uterine fibroid.   Electronically Signed by: Cheryle Cork, MD on 10/19/2024 10:43 AM    Impression   1. Bilateral ovarian cysts     Plan  We reviewed her ultrasound report and  images from yesterday. Small simple cysts discussed. No need for follow up imaging unless symptoms change. 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 Dermatology referral offered but she has already seen them with no real solution. We discussed progesterone  only BCPs to see if that may help but she would like to avoid these if possible. We also discussed seeing integrative medicine specialist to discuss possible compounded hormone solution. She is interested in this and will follow up with [facility] MD. She will return here for CPE. All questions answered.     Patient's Medications       * Accurate as of October 19, 2024  2:25 PM. Reflects encounter med changes as of last refresh          Continued Medications      Instructions  azelastine  0.1 % nasal spray Commonly known as: ASTELIN   ONE SPRAY BY BOTH NOSTRILS ROUTE 2 (TWO) TIMES DAILY. USE IN EACH NOSTRIL AS DIRECTED   BOTOX 200 units injection Generic drug: botulinum toxin type A  200 Units, Intramuscular, Weekly   cholecalciferol 1,000 units (25 mcg) tablet Commonly known as: VITAMIN D-3    FERROUS SULFATE PO  Take by mouth.   FLUoxetine 20 mg capsule Commonly known as: PROZAC  20 mg, 2 times a day   ibuprofen 800 mg tablet Commonly known as: ADVIL,MOTRIN  800 mg, Oral, Every 8 hours as needed   loratadine 10 MG tablet Commonly known as: CLARITIN  1 tablet   methylphenidate HCl 18 mg CR tablet Commonly  known as: CONCERTA  18 mg, Every morning   omega-3 1,000 mg capsule    ondansetron  4 mg tablet Commonly known as: ZOFRAN   4 mg, Oral, Every 6 hours as needed   ubrogepant 100 mg tablet Commonly known as: UBRELVY  100 mg, Oral, As needed, May repeat in 2 hours.  Max 200 mg/24 hours   Vitamin C 100 MG Chew    VITAMIN K2-VITAMIN D3 PO        Modified Medications      Instructions  NEXLETOL 180 MG Tabs tablet Generic drug: bempedoic acid What changed: when to take this  180 mg, Oral, Daily   TIROSINT 37.5  MCG Caps capsule Generic drug: levothyroxine sodium What changed: when to take this  37.5 mcg, Oral, Daily        No orders of the defined types were placed in this encounter.   Risks, benefits, and alternatives of the medications and treatment plan prescribed today were discussed, and patient expressed understanding. Plan follow-up as discussed or as needed if any worsening symptoms or change in condition.    A yearly preventative health exam was recommended and current age based recommendations were discussed.  *Some images could not be shown.

## 2024-10-31 ENCOUNTER — Other Ambulatory Visit: Payer: Self-pay

## 2024-10-31 ENCOUNTER — Inpatient Hospital Stay
Admission: RE | Admit: 2024-10-31 | Discharge: 2024-10-31 | Disposition: A | Discharge status: Home / Self Care | Payer: Self-pay | Source: Ambulatory Visit | Attending: Neurosurgery | Admitting: Neurosurgery

## 2024-10-31 DIAGNOSIS — Z049 Encounter for examination and observation for unspecified reason: Secondary | ICD-10-CM

## 2024-11-10 ENCOUNTER — Ambulatory Visit: Admitting: Neurosurgery

## 2024-11-16 ENCOUNTER — Ambulatory Visit (INDEPENDENT_AMBULATORY_CARE_PROVIDER_SITE_OTHER): Admitting: Neurosurgery

## 2024-11-16 ENCOUNTER — Encounter: Payer: Self-pay | Admitting: Neurosurgery

## 2024-11-16 VITALS — BP 134/89 | HR 100 | Temp 99.0°F | Ht 68.0 in | Wt 170.0 lb

## 2024-11-16 DIAGNOSIS — I7789 Other specified disorders of arteries and arterioles: Secondary | ICD-10-CM

## 2024-11-16 DIAGNOSIS — I671 Cerebral aneurysm, nonruptured: Secondary | ICD-10-CM

## 2024-11-16 NOTE — Progress Notes (Signed)
 " Assessment : 49 year old female with a history of chronic migraines presents for an evaluation of facial droop that occurred late September 2025.   At that time she experienced the sudden onset of right sided facial numbness and mild forehead sparing facial droop while attempting to update her passport.  She reports that her speech was slurred for approximately 2 days, however, she denies any associated weakness in her right arm or leg at that time.  She denied any recent flulike illness around that time but notes having shingles 4 times between 2021 and 2025.  The patient did not seek immediate hospital evaluation, noting a personal preference to avoid clinical settings unless absolutely necessary.  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  Her relevant neurosurgical history includes a yearly observation MRI for suspected intracranial aneurysm by Dr. Delores, though radiologist recently suggested these findings may represent a benign infundibulum.  Her primary goal today is to determine if she experienced a stroke and to discuss strategies for future prevention.  Plan : I reviewed the notes from Dr. 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  The CTA did not confirm this.  It is nebulous whether or not future imaging demonstrated this but given the fact that the CTA did not confirm it, I am not worried about it.  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  This by definition is an infundibulum and I sincerely doubt that this is an aneurysm.  I comforted her with the finding but given the fact that she is 49 years old and concerned about it, I recommended a 5-year follow-up  MRA without contrast.  She voiced understanding that if she starts having more headaches to let me know otherwise I do not believe that this is of any concern.  I told her that none of us  have crystal balls and I cannot tell her what would happen in 10 to 15 years but given the appearance right now, I do not suspect that she has to worry about an aneurysm.  She will get the MRA done in 5 years and we will get this arranged for her, and then I will give her a call with the results once she lets us  know where this was done.   Social History   Socioeconomic History   Marital status: Married    Spouse name: Not on file   Number of children: 1   Years of education: Not on file   Highest education level: Bachelor's degree (e.g., BA, AB, BS)  Occupational History    Comment: work from home  Tobacco Use   Smoking status: Former    Current packs/day: 0.00    Types: Cigarettes    Quit date: 2004    Years since quitting: 22.0   Smokeless tobacco: Never  Vaping Use   Vaping status: Never Used  Substance and Sexual Activity   Alcohol use: Yes    Comment: on occasion   Drug use: Never   Sexual activity: Not on file  Other Topics Concern   Not on file  Social History Narrative   Lives with husband, child   Caffeine- coffee 3-5 c daily   Social Drivers of Health   Tobacco Use:  Medium Risk (11/16/2024)   Patient History    Smoking Tobacco Use: Former    Smokeless Tobacco Use: Never    Passive Exposure: Not on file  Financial Resource Strain: Low Risk (03/19/2024)   Received from [facility]   Overall Financial Resource Strain (CARDIA)    Difficulty of Paying Living Expenses: Not hard at all  Food Insecurity: Low Risk (10/09/2024)   Received from [facility]   Epic    Within the past 12 months, you worried that your food would run out before you got money to buy more: Never true    Within the past 12 months, the food you bought just didn't last and you didn't have money to get more. :  Never true  Transportation Needs: No Transportation Needs (10/09/2024)   Received from [facility]    In the past 12 months, has lack of reliable transportation kept you from medical appointments, meetings, work or from getting things needed for daily living? : No  Physical Activity: Sufficiently Active (03/19/2024)   Received from [facility]   Exercise Vital Sign    On average, how many days per week do you engage in moderate to strenuous exercise (like a brisk walk)?: 4 days    On average, how many minutes do you engage in exercise at this level?: 40 min  Recent Concern: Physical Activity - Insufficiently Active (01/10/2024)   Received from [facility]   Exercise Vital Sign    Days of Exercise per Week: 4 days    Minutes of Exercise per Session: 30 min  Stress: No Stress Concern Present (03/19/2024)   Received from [facility] - Occupational Stress Questionnaire    Feeling of Stress : Only a little  Social Connections: Socially Integrated (03/19/2024)   Received from [facility]   Social Network    How would you rate your social network (family, work, friends)?: Good participation with social networks  Intimate Partner Violence: Not At Risk (03/19/2024)   Received from [facility]   HITS    Over the last 12 months how often did your partner physically hurt you?: Never    Over the last 12 months how often did your partner insult you or talk down to you?: Never    Over the last 12 months how often did your partner threaten you with physical harm?: Never    Over the last 12 months how often did your partner scream or curse at you?: Never  Depression (PHQ2-9): Not on file  Alcohol Screen: Not on file  Housing: Low Risk (10/09/2024)   Received from [facility]   Epic    What is your living situation today?: I have a steady place to live    Think about the place you live. Do you have problems with any of the following?  Choose all that apply:: None/None on this list  Utilities: Low Risk (10/09/2024)   Received from [facility]   Utilities    In the past 12 months has the electric, gas, oil, or water company threatened to shut off services in your home? : No  Health Literacy: Not on file    Family History  Problem Relation Age of Onset   Hashimoto's thyroiditis Mother     Allergies[1]  Past Medical History:  Diagnosis Date   Eczema    Migraines    Shingles 12/2019   Urticaria     Past Surgical History:  Procedure Laterality Date   CESAREAN SECTION     SINOSCOPY     WISDOM TOOTH EXTRACTION       Physical Exam   Physical Exam HENT:     Head: Normocephalic.     Nose: Nose normal.  Eyes:     Pupils: Pupils are equal, round, and reactive to light.  Cardiovascular:     Rate and Rhythm: Normal rate.  Pulmonary:     Effort: Pulmonary effort is normal.  Abdominal:     General: Abdomen is flat.  Musculoskeletal:     Cervical back: Normal range of motion.  Neurological:     Mental Status: Patient is alert.     Cranial Nerves: Cranial nerves 2-12 are intact.     Sensory: Sensation is intact.     Motor: Motor function is intact.     Coordination: Coordination is intact.     No results found for this or any previous visit.     [1]  Allergies Allergen Reactions   Amoxicillin Hives   Corticosteroids Anxiety, Dermatitis, Other (See Comments) and Swelling    Serve Reflux  Serve Reflux    Serve Reflux Serve Reflux  Serve Reflux  Serve Reflux Serve Reflux  Serve Reflux    Serve Reflux  Serve Reflux Serve Reflux    Serve Reflux  Serve Reflux Serve Reflux  Other Reaction(s): Severe acid reflux and anger  Serve Reflux  Serve Reflux   Diltiazem Hcl Anxiety, Other (See Comments), Palpitations, Swelling and Tinitus    Other Reaction(s): Agitation   Food Color Red Dermatitis, Palpitations and Swelling   Metoprolol Swelling    Tongue and throat swelling   Tongue and  throat swelling  Tongue and throat swelling     Tongue and throat swelling   Statins Anaphylaxis and Swelling    Tongue swelling   Tongue swelling   Sulfamethoxazole-Trimethoprim Hives, Nausea And Vomiting and Nausea Only    Other Reaction(s): GI Intolerance   Tramadol Anxiety, Anaphylaxis, Other (See Comments), Palpitations, Swelling and Tinitus    Other Reaction(s): Constipation, Mental Status Changes   Zolmitriptan Palpitations   Cinnamon Other (See Comments)   Erenumab-Aooe Other (See Comments)    hypertension  Other Reaction(s): worse migraines, heart racing, elevated BP  hypertension  hypertension  hypertension    hypertension hypertension   Erythromycin Other (See Comments)   Evolocumab Other (See Comments)    Other Reaction(s): Myalgia   Fluticasone Dermatitis   Hydrocodone -Acetaminophen  Other (See Comments) and Rash    Other Reaction(s): irriable   Latex Dermatitis and Other (See Comments)    Other Reaction(s): Skin rash   Methocarbamol Other (See Comments)    Emotional   Nirmatrelvir-Ritonavir Hives   Norfloxacin Other (See Comments)    Emotional   Orphenadrine Other (See Comments)    Other Reaction(s): Serious emotional distress   Pantoprazole Other (See Comments) and Dermatitis    Other Reaction(s): Mental Status Changes, Other (See Comments)   Red Dye #40 (Allura Red) Rash and Other (See Comments)   Rosuvastatin Anxiety and Other (See Comments)    Throat tightntess   Capsicum     Hives, vomiting, tightness in throat   Levonorgestrel Itching and Other (See Comments)    She gets irritable , and rash from any hormone replacement She gets irritable , and rash from any hormone replacement    Nsaids Other (See Comments)    Other Reaction(s): Other (See Comments)   Orphenadrine Citrate Other (See Comments)    EMOTIONAL  DISTRESS can't stop crying can't stop crying EMOTIONAL DISTRESS    Other Other (See Comments)    all steroids -severe acid  reflux and angry  Hives, vomiting, tightness in throat   Penicillins     Other Reaction(s): hives/swelling   Pregabalin Other (See Comments)   Wound Dressing Adhesive     Other Reaction(s): Skin rash   Gluten Meal Dermatitis, Diarrhea, Other (See Comments), Nausea Only and Swelling   Naproxen Dermatitis   "
